# Patient Record
Sex: Male | Born: 1950 | Race: Black or African American | Hispanic: No | Marital: Married | Smoking: Never smoker
Health system: Southern US, Community
[De-identification: ages and names within clinical notes are randomized; demographics above are authoritative.]

## PROBLEM LIST (undated history)

## (undated) DIAGNOSIS — I10 Essential (primary) hypertension: Secondary | ICD-10-CM

## (undated) DIAGNOSIS — I509 Heart failure, unspecified: Secondary | ICD-10-CM

## (undated) DIAGNOSIS — E119 Type 2 diabetes mellitus without complications: Secondary | ICD-10-CM

## (undated) HISTORY — PX: EYE SURGERY: SHX253

## (undated) HISTORY — DX: Heart failure, unspecified: I50.9

## (undated) HISTORY — PX: HAND TENDON SURGERY: SHX663

---

## 2002-04-14 ENCOUNTER — Emergency Department (HOSPITAL_COMMUNITY): Admission: EM | Admit: 2002-04-14 | Discharge: 2002-04-14 | Payer: Self-pay | Admitting: Emergency Medicine

## 2005-04-06 ENCOUNTER — Emergency Department (HOSPITAL_COMMUNITY): Admission: EM | Admit: 2005-04-06 | Discharge: 2005-04-07 | Payer: Self-pay | Admitting: Emergency Medicine

## 2016-08-01 ENCOUNTER — Ambulatory Visit (INDEPENDENT_AMBULATORY_CARE_PROVIDER_SITE_OTHER): Payer: Commercial Managed Care - HMO | Admitting: Orthopaedic Surgery

## 2016-08-01 ENCOUNTER — Ambulatory Visit (INDEPENDENT_AMBULATORY_CARE_PROVIDER_SITE_OTHER): Payer: Commercial Managed Care - HMO

## 2016-08-01 ENCOUNTER — Encounter (INDEPENDENT_AMBULATORY_CARE_PROVIDER_SITE_OTHER): Payer: Self-pay | Admitting: Orthopaedic Surgery

## 2016-08-01 ENCOUNTER — Encounter (INDEPENDENT_AMBULATORY_CARE_PROVIDER_SITE_OTHER): Payer: Self-pay

## 2016-08-01 VITALS — Ht 70.0 in | Wt 225.0 lb

## 2016-08-01 DIAGNOSIS — M25552 Pain in left hip: Secondary | ICD-10-CM

## 2016-08-01 DIAGNOSIS — M1611 Unilateral primary osteoarthritis, right hip: Secondary | ICD-10-CM

## 2016-08-01 NOTE — Progress Notes (Signed)
Office Visit Note   Patient: Frederick Harris           Date of Birth: 1950-12-21           MRN: [id number] Visit Date: 08/01/2016              Requested by: No referring provider defined for this encounter. PCP: Frederick HidesMILLER, RYAN DEAN, PA   Assessment & Plan: Visit Diagnoses:  1. Pain in left hip   2. Unilateral primary osteoarthritis, right hip     Plan: He would like to try an intra-articular injection under fluoroscopy by Dr. Alvester MorinNewton and his left hip. We will work on arranging that. I'll see him back myself in 4 weeks and all of artery had injection in his left hip ointment go over things from there. We had a long thorough discussion about what the disease process arthrodesis in his left hip. He's had successful injections in his shoulder and knee before and like to at least try this and his hip in spite of the arthritis and I agree with him trying this conservative approach.  Follow-Up Instructions: Return in about 4 weeks (around 08/29/2016).   Orders:  Orders Placed This Encounter  Procedures  . XR HIP UNILAT W OR W/O PELVIS 2-3 VIEWS LEFT   No orders of the defined types were placed in this encounter.     Procedures: No procedures performed   Clinical Data: No additional findings.   Subjective: Chief Complaint  Patient presents with  . Left Hip - Pain    Chronic left hip pain, worse with walking. Pain comes and goes. NKI but did play basketball and golf. Saw Dr. Otelia SergeantNitka in 2014 for left hip.     HPI The patient is been having chronic pain and stiffness his left hip for long period of time now hurts mainly in the groin and hurts with activities. Dr. Otelia SergeantNitka to send my way to evaluate his hip again. He first started Dr. Theda Sersnegative for the hip in 2014. He seen Dr. Otelia SergeantNitka up for an knee and back issues recently as well. He's done well with injections in the past in other areas. He is a diabetic but his hemoglobin A1c was below 7. He is active in the gym working out. He is retired  now. Review of Systems He denies a chest pain, headache, short of breath, fever, chills, nausea, vomiting.  Objective: Vital Signs: Ht 5\' 10"  (1.778 m)   Wt 225 lb (102.1 kg)   BMI 32.28 kg/m   Physical Exam He is alert and oriented 3 in no acute distress Ortho Exam Examination of his left hip shows significant stiffness with attempts of internal/external rotation. His leg lengths are equal. His right hip exam is normal. His left knee exam is normal. Specialty Comments:  No specialty comments available.  Imaging: Xr Hip Unilat W Or W/o Pelvis 2-3 Views Left  Result Date: 08/01/2016 An AP pelvis and lateral of his left hip shows severe osteoarthritis and degenerative joint disease. There is superior lateral joint space narrowing is quite significant. There is cystic changes in the acetabular side of things. This particular osteophytes as well as sclerotic changes.    PMFS History: There are no active problems to display for this patient.  No past medical history on file.  No family history on file.  No past surgical history on file. Social History   Occupational History  . Not on file.   Social History Main Topics  . Smoking  status: Never Smoker  . Smokeless tobacco: Never Used  . Alcohol use No  . Drug use: No  . Sexual activity: Not on file

## 2016-08-02 ENCOUNTER — Other Ambulatory Visit (INDEPENDENT_AMBULATORY_CARE_PROVIDER_SITE_OTHER): Payer: Self-pay

## 2016-08-02 DIAGNOSIS — M25552 Pain in left hip: Secondary | ICD-10-CM

## 2016-08-18 ENCOUNTER — Encounter (INDEPENDENT_AMBULATORY_CARE_PROVIDER_SITE_OTHER): Payer: Self-pay | Admitting: Physical Medicine and Rehabilitation

## 2016-08-18 ENCOUNTER — Ambulatory Visit (INDEPENDENT_AMBULATORY_CARE_PROVIDER_SITE_OTHER): Payer: Commercial Managed Care - HMO

## 2016-08-18 ENCOUNTER — Ambulatory Visit (INDEPENDENT_AMBULATORY_CARE_PROVIDER_SITE_OTHER): Payer: Commercial Managed Care - HMO | Admitting: Physical Medicine and Rehabilitation

## 2016-08-18 VITALS — BP 130/86

## 2016-08-18 DIAGNOSIS — M25552 Pain in left hip: Secondary | ICD-10-CM | POA: Diagnosis not present

## 2016-08-18 NOTE — Patient Instructions (Signed)

## 2016-08-18 NOTE — Progress Notes (Signed)
   Frederick Harris - 66 y.o. male MRN [id number]  Date of birth: [birth year]  Office Visit Note: Visit Date: 08/18/2016 PCP: Maye Hides, PA Referred by: Maye Hides, PA  Subjective: Chief Complaint  Patient presents with  . Left Hip - Pain   HPI: [name] is a 66 year old gentleman suffering and complaining of left hip and groin pain for approximately 1 year. Gotten worse over time. Worse with stepping up and walking up hill. Denies groin pain today. He has been followed by Dr. Magnus Ivan who request diagnostic of therapeutic anesthetic hip arthrogram.    ROS Otherwise per HPI.  Assessment & Plan: Visit Diagnoses:  1. Pain in left hip     Plan: Findings:  Left diagnostic and therapeutic anesthetic hip arthrogram. Patient did get a leaf of his symptoms during the anesthetic phase.    Meds & Orders: No orders of the defined types were placed in this encounter.   Orders Placed This Encounter  Procedures  . Large Joint Injection/Arthrocentesis  . XR C-ARM NO REPORT    Follow-up: Return if symptoms worsen or fail to improve.   Procedures: Large Joint Inj Date/Time: 08/18/2016 2:59 PM Performed by: Tyrell Antonio Authorized by: Tyrell Antonio   Consent Given by:  Patient Site marked: the procedure site was marked   Timeout: prior to procedure the correct patient, procedure, and site was verified   Indications:  Pain and diagnostic evaluation Location:  Hip Site:  L hip joint Prep: patient was prepped and draped in usual sterile fashion   Needle Size:  22 G Approach:  Anterior Ultrasound Guidance: No   Fluoroscopic Guidance: No   Arthrogram: Yes   Medications:  80 mg triamcinolone acetonide 40 MG/ML; 3 mL bupivacaine 0.5 % Aspiration Attempted: Yes   Patient tolerance:  Patient tolerated the procedure well with no immediate complications  Arthrogram demonstrated excellent flow of contrast throughout the joint surface without extravasation or obvious defect.   The patient had relief of symptoms during the anesthetic phase of the injection.     No notes on file   Clinical History: No specialty comments available.  He reports that he has never smoked. He has never used smokeless tobacco. No results for input(s): HGBA1C, LABURIC in the last 8760 hours.  Objective:  VS:  HT:    WT:   BMI:     BP:130/86  HR: bpm  TEMP: ( )  RESP:  Physical Exam  Musculoskeletal:  Patient with decreased range of motion on the left hip with groin pain on internal rotation.    Ortho Exam Imaging: No results found.  Past Medical/Family/Surgical/Social History: Medications & Allergies reviewed per EMR There are no active problems to display for this patient.  No past medical history on file. No family history on file. No past surgical history on file. Social History   Occupational History  . Not on file.   Social History Main Topics  . Smoking status: Never Smoker  . Smokeless tobacco: Never Used  . Alcohol use No  . Drug use: No  . Sexual activity: Not on file

## 2016-08-22 MED ORDER — BUPIVACAINE HCL 0.5 % IJ SOLN
3.0000 mL | INTRAMUSCULAR | Status: AC | PRN
  Administered 2016-08-18: 3 mL via INTRA_ARTICULAR

## 2016-08-22 MED ORDER — TRIAMCINOLONE ACETONIDE 40 MG/ML IJ SUSP
80.0000 mg | INTRAMUSCULAR | Status: AC | PRN
  Administered 2016-08-18: 80 mg via INTRA_ARTICULAR

## 2016-09-01 ENCOUNTER — Ambulatory Visit (INDEPENDENT_AMBULATORY_CARE_PROVIDER_SITE_OTHER): Payer: Commercial Managed Care - HMO | Admitting: Orthopaedic Surgery

## 2016-09-21 ENCOUNTER — Ambulatory Visit (INDEPENDENT_AMBULATORY_CARE_PROVIDER_SITE_OTHER): Payer: Commercial Managed Care - HMO | Admitting: Orthopaedic Surgery

## 2017-03-09 ENCOUNTER — Other Ambulatory Visit (INDEPENDENT_AMBULATORY_CARE_PROVIDER_SITE_OTHER): Payer: Self-pay

## 2017-03-09 ENCOUNTER — Ambulatory Visit (INDEPENDENT_AMBULATORY_CARE_PROVIDER_SITE_OTHER): Payer: Commercial Managed Care - HMO | Admitting: Orthopaedic Surgery

## 2017-03-09 DIAGNOSIS — M25552 Pain in left hip: Secondary | ICD-10-CM

## 2017-03-09 DIAGNOSIS — M1612 Unilateral primary osteoarthritis, left hip: Secondary | ICD-10-CM | POA: Diagnosis not present

## 2017-03-09 DIAGNOSIS — M7062 Trochanteric bursitis, left hip: Secondary | ICD-10-CM

## 2017-03-09 MED ORDER — TRAMADOL HCL 50 MG PO TABS: 50.0000 mg | ORAL_TABLET | Freq: Four times a day (QID) | ORAL | 0 refills | Status: DC | PRN

## 2017-03-09 MED ORDER — METHYLPREDNISOLONE ACETATE 40 MG/ML IJ SUSP
40.0000 mg | INTRAMUSCULAR | Status: AC | PRN
  Administered 2017-03-09: 40 mg via INTRA_ARTICULAR

## 2017-03-09 MED ORDER — LIDOCAINE HCL 1 % IJ SOLN
3.0000 mL | INTRAMUSCULAR | Status: AC | PRN
  Administered 2017-03-09: 3 mL

## 2017-03-09 NOTE — Progress Notes (Signed)
Office Visit Note   Patient: Frederick Harris           Date of Birth: [birth year]           MRN: [id number] Visit Date: 03/09/2017              Requested by: Maye HidesMiller, Ryan Dean, PA [address] [place], [place] [id number] PCP: Maye HidesMiller, Ryan Dean, PA   Assessment & Plan: Visit Diagnoses:  1. Unilateral primary osteoarthritis, left hip   2. Trochanteric bursitis, left hip     Plan: He has known end-stage arthritis of his left hip.  He also has associated trochanteric bursitis.  He tolerated the steroid injection of the trochanteric area today.  We will set him up for an intra-articular injection soon of the left hip with Dr. Alvester MorinNewton.  This would be a steroid injection of that left hip as well.  I talked him about hip replacement surgery at some point.  He knows to weight still at least 5-6 months between intra-articular steroid injections for hip.  Follow-Up Instructions: Return if symptoms worsen or fail to improve.   Orders:  Orders Placed This Encounter  Procedures  . Large Joint Injection/Arthrocentesis   Meds ordered this encounter  Medications  . traMADol (ULTRAM) 50 MG tablet    Sig: Take 1 tablet (50 mg total) by mouth every 6 (six) hours as needed.    Dispense:  30 tablet    Refill:  0      Procedures: Large Joint Inj Date/Time: 03/09/2017 4:29 PM Performed by: Kathryne HitchBLACKMAN, Yanely Mast Y Authorized by: Kathryne HitchBLACKMAN, Millissa Deese Y   Location:  Hip Site:  L greater trochanter Ultrasound Guidance: No   Fluoroscopic Guidance: No   Arthrogram: No   Medications:  3 mL lidocaine 1 %; 40 mg methylPREDNISolone acetate 40 MG/ML     Clinical Data: No additional findings.   Subjective: No chief complaint on file. Patient is well-known to us.  He has debilitating arthritis of his left hip with also associated trochanteric bursitis.  He last had a intra-articular steroid injection in early April of this year.  He knows to wait at least 5-6 months between steroid injections in the  hip joint.  He still is not ready for hip replacement surgery.  His hip pain is gotten bad again he would like to have an intra-articular injection as well as a trochanteric injection.  He has had no other changes in medical status.   HPI  Review of Systems He currently denies any headache, chest pain, shortness of breath, fever, chills, nausea, vomiting.  Objective: Vital Signs: There were no vitals taken for this visit.  Physical Exam Is alert and oriented x3 in no acute distress Ortho Exam Examination of his right hip is almost normal.  Examination of his left hip shows limitations with internal and external rotation with pain in the groin as well as pain of the trochanteric area. Specialty Comments:  No specialty comments available.  Imaging: No results found.   PMFS History: Patient Active Problem List   Diagnosis Date Noted  . Unilateral primary osteoarthritis, left hip 03/09/2017   No past medical history on file.  No family history on file.  No past surgical history on file. Social History   Occupational History  . Not on file.   Social History Main Topics  . Smoking status: Never Smoker  . Smokeless tobacco: Never Used  . Alcohol use No  . Drug use: No  . Sexual activity:  Not on file

## 2017-08-08 ENCOUNTER — Encounter (INDEPENDENT_AMBULATORY_CARE_PROVIDER_SITE_OTHER): Payer: Self-pay | Admitting: Orthopaedic Surgery

## 2017-08-08 ENCOUNTER — Ambulatory Visit (INDEPENDENT_AMBULATORY_CARE_PROVIDER_SITE_OTHER): Payer: Medicare Other

## 2017-08-08 ENCOUNTER — Ambulatory Visit (INDEPENDENT_AMBULATORY_CARE_PROVIDER_SITE_OTHER): Payer: Medicare Other | Admitting: Orthopaedic Surgery

## 2017-08-08 DIAGNOSIS — M5441 Lumbago with sciatica, right side: Secondary | ICD-10-CM | POA: Diagnosis not present

## 2017-08-08 DIAGNOSIS — M5442 Lumbago with sciatica, left side: Secondary | ICD-10-CM

## 2017-08-08 DIAGNOSIS — G8929 Other chronic pain: Secondary | ICD-10-CM

## 2017-08-08 MED ORDER — CYCLOBENZAPRINE HCL 10 MG PO TABS: 10.0000 mg | ORAL_TABLET | Freq: Every day | ORAL | 1 refills | Status: DC

## 2017-08-08 NOTE — Progress Notes (Signed)
Office Visit Note   Patient: Frederick Harris           Date of Birth: Oct 22, 1950           MRN: [id number] Visit Date: 08/08/2017              Requested by: Maye HidesMiller, Ryan Dean, PA [address] [place], [place] [id number] PCP: Maye HidesMiller, Ryan Dean, PA   Assessment & Plan: Visit Diagnoses:  1. Chronic bilateral low back pain with bilateral sciatica     Plan: We will send him to physical therapy for core strengthening, modalities stretching and home exercise program.  See him back in a month to check his progress lack of.  Ligaments of x-ray of her muscle spasm mostly at night.  In regards to his hip this point in time he does not wish to have an intra-articular injection or surgical  Follow-Up Instructions: Return in about 1 month (around 09/05/2017).   Orders:  Orders Placed This Encounter  Procedures  . XR Lumbar Spine 2-3 Views   Meds ordered this encounter  Medications  . cyclobenzaprine (FLEXERIL) 10 MG tablet    Sig: Take 1 tablet (10 mg total) by mouth at bedtime.    Dispense:  40 tablet    Refill:  1      Procedures: No procedures performed   Clinical Data: No additional findings.   Subjective: Chief Complaint  Patient presents with  . Lower Back - Pain    HPI [name] is well-known to Dr. Fortino SicMunson recently comes in today due to some low back pain which he has had for some period of time years is gotten worse over the last few weeks.  He states his been having some muscle spasm in the low back.  He is also having some radicular symptoms down the left leg into the foot at times.  Pain does awaken him at times.  He is tried some over-the-counter rubs on his back and also changes in the way he sleeps with a rolled up cushion under his back at times that helps alleviate some of the pain.  He is diabetic hemoglobin A1c/6.9 he states his glucose levels run around 110 in the mornings.  He has known osteoarthritis of his left hip.  Does have some groin pain left hip at times with  certain Review of Systems No bowel bladder dysfunction.  Please see HPI otherwise review of systems is negative.  Objective: Vital Signs: There were no vitals taken for this visit.  Physical Exam  Constitutional: He is oriented to person, place, and time. He appears well-developed and well-nourished. No distress.  Cardiovascular: Intact distal pulses.  Pulmonary/Chest: Effort normal.  Neurological: He is alert and oriented to person, place, and time.  Skin: He is not diaphoretic.  Psychiatric: He has a normal mood and affect.    Ortho Exam Bilateral hips good range of motion left hip pain with internal/external rotation.  And slight tenderness of the left trochanteric region.  No tenderness over the lumbar spine or paraspinous region.  Negative straight leg raise bilaterally.  5 out of 5 strength bilateral lower extremities against resistance.  Deep tendon reflexes are equal and symmetric at the knees and ankles.  Dorsal pedal pulses are 2+ bilaterally sensation grossly intact bilateral feet light touch.  He is able to touch his toes and has limited extension of lumbar spine with some discomfort.  Specialty Comments:  No specialty comments available.  Imaging: Xr Lumbar Spine 2-3  Views  Result Date: 08/08/2017 Lumbar spine 2 views.  No acute fractures.  Decreased disc space at L3-4 L4-5 and L5-S1 with plate spurring near fusion of the L4-5 osteophytes.  Loss of lordotic curvature.  No spondylolisthesis.  Slight scoliosis.    PMFS History: Patient Active Problem List   Diagnosis Date Noted  . Unilateral primary osteoarthritis, left hip 03/09/2017   History reviewed. No pertinent past medical history.  History reviewed. No pertinent family history.  History reviewed. No pertinent surgical history. Social History   Occupational History  . Not on file  Tobacco Use  . Smoking status: Never Smoker  . Smokeless tobacco: Never Used  Substance and Sexual Activity  . Alcohol use: No   . Drug use: No  . Sexual activity: Not on file

## 2017-09-12 ENCOUNTER — Ambulatory Visit (INDEPENDENT_AMBULATORY_CARE_PROVIDER_SITE_OTHER): Payer: Medicare Other | Admitting: Orthopaedic Surgery

## 2018-08-11 ENCOUNTER — Encounter (HOSPITAL_COMMUNITY): Payer: Self-pay | Admitting: Emergency Medicine

## 2018-08-11 ENCOUNTER — Emergency Department (HOSPITAL_COMMUNITY)
Admission: EM | Admit: 2018-08-11 | Discharge: 2018-08-11 | Disposition: A | Discharge status: Home / Self Care | Payer: Medicare Other | Attending: Emergency Medicine | Admitting: Emergency Medicine

## 2018-08-11 ENCOUNTER — Emergency Department (HOSPITAL_COMMUNITY): Payer: Medicare Other

## 2018-08-11 ENCOUNTER — Other Ambulatory Visit: Payer: Self-pay

## 2018-08-11 DIAGNOSIS — Y939 Activity, unspecified: Secondary | ICD-10-CM | POA: Insufficient documentation

## 2018-08-11 DIAGNOSIS — Z23 Encounter for immunization: Secondary | ICD-10-CM | POA: Diagnosis not present

## 2018-08-11 DIAGNOSIS — W269XXA Contact with unspecified sharp object(s), initial encounter: Secondary | ICD-10-CM | POA: Insufficient documentation

## 2018-08-11 DIAGNOSIS — Z7984 Long term (current) use of oral hypoglycemic drugs: Secondary | ICD-10-CM | POA: Diagnosis not present

## 2018-08-11 DIAGNOSIS — S61216A Laceration without foreign body of right little finger without damage to nail, initial encounter: Secondary | ICD-10-CM | POA: Diagnosis not present

## 2018-08-11 DIAGNOSIS — Y92838 Other recreation area as the place of occurrence of the external cause: Secondary | ICD-10-CM | POA: Insufficient documentation

## 2018-08-11 DIAGNOSIS — Y999 Unspecified external cause status: Secondary | ICD-10-CM | POA: Insufficient documentation

## 2018-08-11 DIAGNOSIS — Z79899 Other long term (current) drug therapy: Secondary | ICD-10-CM | POA: Diagnosis not present

## 2018-08-11 DIAGNOSIS — W010XXA Fall on same level from slipping, tripping and stumbling without subsequent striking against object, initial encounter: Secondary | ICD-10-CM | POA: Insufficient documentation

## 2018-08-11 DIAGNOSIS — E119 Type 2 diabetes mellitus without complications: Secondary | ICD-10-CM | POA: Diagnosis not present

## 2018-08-11 DIAGNOSIS — I10 Essential (primary) hypertension: Secondary | ICD-10-CM | POA: Diagnosis not present

## 2018-08-11 HISTORY — DX: Type 2 diabetes mellitus without complications: E11.9

## 2018-08-11 HISTORY — DX: Essential (primary) hypertension: I10

## 2018-08-11 MED ORDER — LIDOCAINE HCL 2 % IJ SOLN
INTRAMUSCULAR | Status: AC
  Administered 2018-08-11: 18:00:00 200 mg
  Filled 2018-08-11: qty 20

## 2018-08-11 MED ORDER — TETANUS-DIPHTH-ACELL PERTUSSIS 5-2.5-18.5 LF-MCG/0.5 IM SUSP
0.5000 mL | Freq: Once | INTRAMUSCULAR | Status: AC
  Administered 2018-08-11: 17:00:00 0.5 mL via INTRAMUSCULAR
  Filled 2018-08-11: qty 0.5

## 2018-08-11 MED ORDER — LIDOCAINE HCL 2 % IJ SOLN
10.0000 mL | Freq: Once | INTRAMUSCULAR | Status: AC
  Administered 2018-08-11: 18:00:00 200 mg

## 2018-08-11 MED ORDER — LIDOCAINE-EPINEPHRINE (PF) 2 %-1:200000 IJ SOLN: 10.0000 mL | Freq: Once | INTRAMUSCULAR | Status: DC

## 2018-08-11 MED ORDER — LIDOCAINE-EPINEPHRINE (PF) 2 %-1:200000 IJ SOLN
10.0000 mL | Freq: Once | INTRAMUSCULAR | Status: DC
  Filled 2018-08-11: qty 20

## 2018-08-11 NOTE — Discharge Instructions (Addendum)
Keep wound clean with mild soap and water. Wear splint during the day to help prevent bending your finger excessively which may make the wound open up. Keep area covered with a topical antibiotic ointment and bandage, keep bandage dry, and do not submerge in water for 24 hours. Ice and elevate for additional pain and swelling relief. Alternate between Ibuprofen and Tylenol for additional pain relief. Follow up with your primary care doctor or the [facility] in approximately 3 days for wound recheck and again in 7-10 days for recheck and suture removal. Monitor area for signs of infection to include, but not limited to: increasing pain, spreading redness, drainage/pus, worsening swelling, or fevers. Return to emergency department for emergent changing or worsening symptoms.

## 2018-08-11 NOTE — ED Notes (Signed)
Bed: WTR5 Expected date:  Expected time:  Means of arrival:  Comments: 

## 2018-08-11 NOTE — ED Provider Notes (Signed)
[facility]-EMERGENCY DEPT Provider Note   CSN: [id number] Arrival date & time: 08/11/18  1634    History   Chief Complaint Chief Complaint  Patient presents with   Finger Injury    HPI    Frederick Harris is a 68 y.o. male with a PMHx of HTN and DM2, who presents to the ED with complaints of right pinky laceration sustained around 11 AM, approximately 6 hours prior to evaluation.  Patient states that he slipped and fell with something in his hand which caused the laceration.  The bleeding has been controlled with a bandage.  He denies having any pain.  He has not done anything for it, no known aggravating factors.  He also denies having any loss of range of motion of the finger, numbness, tingling, focal weakness, or any other complaints or concerns at this time.  No other injury sustained during the incident.  Of note, he takes blood pressure medication, took it this morning, denies having any headache, vision changes, chest pain, shortness of breath, or any other complaints related to high blood pressure.  He does not have a hand specialist that he sees.  His last tetanus shot was about 6 years ago.  The history is provided by the patient and medical records. No language interpreter was used.    Past Medical History:  Diagnosis Date   Diabetes mellitus without complication (HCC)    Hypertension     Patient Active Problem List   Diagnosis Date Noted   Unilateral primary osteoarthritis, left hip 03/09/2017    History reviewed. No pertinent surgical history.      Home Medications    Prior to Admission medications   Medication Sig Start Date End Date Taking? Authorizing Provider  cyclobenzaprine (FLEXERIL) 10 MG tablet Take 1 tablet (10 mg total) by mouth at bedtime. 08/08/17   Kirtland Bouchard, PA-C  losartan (COZAAR) 25 MG tablet Take 25 mg by mouth daily.    [provider]  metFORMIN (GLUCOPHAGE) 500 MG tablet Take by mouth 2 (two) times daily  with a meal.    [provider]  traMADol (ULTRAM) 50 MG tablet Take 1 tablet (50 mg total) by mouth every 6 (six) hours as needed. 03/09/17   Kathryne Hitch, MD    Family History No family history on file.  Social History Social History   Tobacco Use   Smoking status: Never Smoker   Smokeless tobacco: Never Used  Substance Use Topics   Alcohol use: No   Drug use: No     Allergies   Iodine   Review of Systems Review of Systems  Eyes: Negative for visual disturbance.  Respiratory: Negative for shortness of breath.   Cardiovascular: Negative for chest pain.  Musculoskeletal: Negative for arthralgias and myalgias.  Skin: Positive for wound.  Allergic/Immunologic: Positive for immunocompromised state (DM2).  Neurological: Negative for weakness, numbness and headaches.     Physical Exam Updated Vital Signs BP (!) 175/87 (BP Location: Right Arm)    Pulse 71    Temp 98.1 F (36.7 C) (Oral)    Resp 18    SpO2 98%   Physical Exam Vitals signs and nursing note reviewed.  Constitutional:      General: He is not in acute distress.    Appearance: Normal appearance. He is well-developed. He is not toxic-appearing.     Comments: Afebrile, nontoxic, NAD, HTN noted  HENT:     Head: Normocephalic and atraumatic.  Eyes:  General:        Right eye: No discharge.        Left eye: No discharge.     Conjunctiva/sclera: Conjunctivae normal.  Neck:     Musculoskeletal: Normal range of motion and neck supple.  Cardiovascular:     Rate and Rhythm: Normal rate.     Pulses: Normal pulses.  Pulmonary:     Effort: Pulmonary effort is normal. No respiratory distress.  Abdominal:     General: There is no distension.  Musculoskeletal: Normal range of motion.     Right hand: He exhibits tenderness and laceration. He exhibits normal range of motion, no bony tenderness, normal capillary refill, no deformity and no swelling. Normal sensation noted. Normal strength  noted.       Hands:     Comments: R pinky with FROM intact at all joints of the digit, with V shaped ~3cm (total length) laceration to the palmar aspect of the proximal phalanx, no ongoing bleeding, no retained FBs noted, does not extend to underlying muscle/tendon/bone. Minimal TTP to the wound but otherwise no bony or joint TTP to remainder of the digit or hand. No crepitus or deformity. No bruising or swelling. Strength and sensation grossly intact, distal pulses intact, soft compartments. SEE PICTURE BELOW  Skin:    General: Skin is warm and dry.     Findings: Laceration present. No rash.     Comments: R pinky lac as mentioned above and pictured below  Neurological:     Mental Status: He is alert and oriented to person, place, and time.     Sensory: Sensation is intact. No sensory deficit.     Motor: Motor function is intact.  Psychiatric:        Mood and Affect: Mood and affect normal.        Behavior: Behavior normal.        ED Treatments / Results  Labs (all labs ordered are listed, but only abnormal results are displayed) Labs Reviewed - No data to display  EKG None  Radiology Dg Hand Complete Right  Result Date: 08/11/2018 CLINICAL DATA:  Fall with laceration fifth MCP joint. EXAM: RIGHT HAND - COMPLETE 3+ VIEW COMPARISON:  None. FINDINGS: Mild degenerate change over the radiocarpal joint. Mild-to-moderate degenerative changes over the carpal bones with moderate degenerative change of the first carpometacarpal joint. Moderate degenerative changes of the MCP joints with post osteophytes of the metacarpal heads. Mild degenerate change of the interphalangeal joints. No acute fracture or dislocation. IMPRESSION: No acute findings. Moderate degenerative changes as described without osteophytes of the metacarpal heads as can be seen in hemochromatosis and CPPD. Electronically Signed   By: Elberta Fortisaniel  Boyle M.D.   On: 08/11/2018 17:17    Procedures .Marland Kitchen.Laceration Repair Date/Time:  08/11/2018 5:38 PM Performed by: Rhona RaiderStreet, Melecio Cueto, PA-C Authorized by: Rhona RaiderStreet, Tysheena Ginzburg, [place]-C   Consent:    Consent obtained:  Verbal   Consent given by:  Patient   Risks discussed:  Pain   Alternatives discussed:  No treatment Anesthesia (see MAR for exact dosages):    Anesthesia method:  Local infiltration   Local anesthetic:  Lidocaine 2% w/o epi Laceration details:    Location:  Finger   Finger location:  R small finger   Length (cm):  3   Depth (mm):  6 Repair type:    Repair type:  Simple Pre-procedure details:    Preparation:  Patient was prepped and draped in usual sterile fashion and imaging obtained to evaluate for  foreign bodies Exploration:    Hemostasis achieved with:  Direct pressure   Wound exploration: wound explored through full range of motion and entire depth of wound probed and visualized     Wound extent: no foreign bodies/material noted, no tendon damage noted and no underlying fracture noted     Contaminated: no   Treatment:    Area cleansed with:  Saline   Amount of cleaning:  Standard   Irrigation solution:  Sterile saline   Irrigation method:  Syringe Skin repair:    Repair method:  Sutures   Suture size:  5-0   Suture material:  Prolene   Suture technique:  Simple interrupted   Number of sutures:  3 Approximation:    Approximation:  Close Post-procedure details:    Dressing:  Splint for protection and non-adherent dressing   Patient tolerance of procedure:  Tolerated well, no immediate complications   (including critical care time)  Medications Ordered in ED Medications  lidocaine (XYLOCAINE) 2 % (with pres) injection 200 mg (has no administration in time range)  Tdap (BOOSTRIX) injection 0.5 mL (0.5 mLs Intramuscular Given 08/11/18 1713)     Initial Impression / Assessment and Plan / ED Course  I have reviewed the triage vital signs and the nursing notes.  Pertinent labs & imaging results that were available during my care of the patient  were reviewed by me and considered in my medical decision making (see chart for details).        68 y.o. male here with R pinky laceration sustained about 6hrs prior to evaluation. Tripped and fell with something in his hand and it cut his pinky. On exam, V shaped ~3cm (total length) laceration to palmar aspect of R 5th digit over the proximal phalanx, no ongoing bleeding, no retained FBs, FROM intact in digit, no tendon/muscle or osseous involvement. No crepitus or deformity. Mild TTP overlying the wound. NVI. Will update Tdap, obtain xray, and plan for suture closure. Of note, pt hypertensive but asymptomatic, doubt need for emergent work up at this time.   5:50 PM R hand xray without acute findings, some moderate degenerative changes without osteophytes of several areas incidentally noted. Wound repaired with 5-0 prolene x3 simple interrupted sutures, adequate hemostasis and cosmesis achieved. Some movement of the wound with finger flexion, will apply splint while wound is healing just for protection of the sutures. Advised proper wound care, RICE, tylenol/motrin for pain, and f/up with PCP in 3 days for wound check and in 7-10 days for suture removal. Doubt need for ppx abx.  I explained the diagnosis and have given explicit precautions to return to the ER including for any other new or worsening symptoms. The patient understands and accepts the medical plan as it's been dictated and I have answered their questions. Discharge instructions concerning home care and prescriptions have been given. The patient is STABLE and is discharged to home in good condition.    Final Clinical Impressions(s) / ED Diagnoses   Final diagnoses:  Laceration of right little finger without foreign body without damage to nail, initial encounter    ED Discharge Orders    [address], [place], [place] 08/11/18 1753    Arby Barrette, MD 08/12/18 1134

## 2018-08-11 NOTE — ED Triage Notes (Addendum)
[place] left 5th finger on something when fell.

## 2019-08-20 ENCOUNTER — Ambulatory Visit: Payer: No Typology Code available for payment source | Admitting: Orthopaedic Surgery

## 2019-08-21 ENCOUNTER — Other Ambulatory Visit: Payer: Self-pay

## 2019-08-21 ENCOUNTER — Ambulatory Visit (INDEPENDENT_AMBULATORY_CARE_PROVIDER_SITE_OTHER): Payer: Medicare Other

## 2019-08-21 ENCOUNTER — Ambulatory Visit (INDEPENDENT_AMBULATORY_CARE_PROVIDER_SITE_OTHER): Payer: Medicare Other | Admitting: Orthopaedic Surgery

## 2019-08-21 DIAGNOSIS — M1612 Unilateral primary osteoarthritis, left hip: Secondary | ICD-10-CM | POA: Diagnosis not present

## 2019-08-21 DIAGNOSIS — M25551 Pain in right hip: Secondary | ICD-10-CM | POA: Diagnosis not present

## 2019-08-21 DIAGNOSIS — M1611 Unilateral primary osteoarthritis, right hip: Secondary | ICD-10-CM | POA: Diagnosis not present

## 2019-08-21 NOTE — Progress Notes (Signed)
Office Visit Note   Patient: Frederick Harris           Date of Birth: [birth year]           MRN: [id number] Visit Date: 08/21/2019              Requested by: Maye Hides, PA [address] [place],  [place] [address] PCP: Maye Hides, PA   Assessment & Plan: Visit Diagnoses:  1. Pain in right hip   2. Unilateral primary osteoarthritis, left hip   3. Unilateral primary osteoarthritis, right hip     Plan: He understands that he does have significant arthritis in both hips.  He has had a successful intra-articular injection remotely in the past in his left hip.  That was as far back as I believe 2019.  I think it is reasonable to try a steroid injection under fluoroscopy by Dr. Alvester Morin in both hips.  The patient agrees with this treatment plan.  Once he has those injections he can follow-up with me as needed understanding that he needs to wait at least 5 to 6 months or more between injections or consider hip replacement surgery at some point if his pain worsens.  All questions and concerns were answered and addressed.  Follow-Up Instructions: Return if symptoms worsen or fail to improve.   Orders:  Orders Placed This Encounter  Procedures  . XR HIP UNILAT W OR W/O PELVIS 2-3 VIEWS RIGHT   No orders of the defined types were placed in this encounter.     Procedures: No procedures performed   Clinical Data: No additional findings.   Subjective: Chief Complaint  Patient presents with  . Right Hip - Pain  Patient is a 69 year old gentleman seen before due to moderate arthritis of his left hip.  He comes in today actually for right hip and groin pain.  He has still had some occasional left hip and groin pain.  At some point he had thought about hip replacement surgery.  He is an avid Teacher, English as a foreign language.  He is also prediabetic but not a smoker.  He said no other acute changes in medical status but certainly has been having worsening pain going the right hip with pivoting activities.  He  is an avid golfer as well.  HPI  Review of Systems He currently denies any headache, chest pain, shortness of breath, fever, chills, nausea, vomiting  Objective: Vital Signs: There were no vitals taken for this visit.  Physical Exam He is alert and orient x3 in no acute distress Ortho Exam Examination of both hips show stiffness with internal and external rotation with pain in the groin on both sides. Specialty Comments:  No specialty comments available.  Imaging: XR HIP UNILAT W OR W/O PELVIS 2-3 VIEWS RIGHT  Result Date: 08/21/2019 A low AP pelvis and lateral of the right hip shows both hips having significant arthritic findings with para-articular osteophytes and significant joint space narrowing.  There is otherwise no acute findings.    PMFS History: Patient Active Problem List   Diagnosis Date Noted  . Unilateral primary osteoarthritis, right hip 08/21/2019  . Unilateral primary osteoarthritis, left hip 03/09/2017   Past Medical History:  Diagnosis Date  . Diabetes mellitus without complication (HCC)   . Hypertension     No family history on file.  No past surgical history on file. Social History   Occupational History  . Not on file  Tobacco Use  . Smoking status: Never Smoker  .  Smokeless tobacco: Never Used  Substance and Sexual Activity  . Alcohol use: No  . Drug use: No  . Sexual activity: Not on file

## 2019-08-23 ENCOUNTER — Telehealth: Payer: Self-pay | Admitting: Orthopaedic Surgery

## 2019-08-23 MED ORDER — TRAMADOL HCL 50 MG PO TABS: 50.0000 mg | ORAL_TABLET | Freq: Four times a day (QID) | ORAL | 0 refills | Status: DC | PRN

## 2019-08-23 NOTE — Telephone Encounter (Signed)
Patient's wife called stating that the patient seen Dr. Magnus Ivan on Wednesday and was suppose to send in an RX for Tramadol, but they have not heard anything from the Pharmacy.  Patient uses [facility] [address].  CB#[phone].  Thank you.

## 2019-08-23 NOTE — Telephone Encounter (Signed)
Something must have happened when I send it in.  I will go ahead and send in again right now.

## 2019-08-23 NOTE — Telephone Encounter (Signed)
Please advise 

## 2020-01-02 ENCOUNTER — Encounter (INDEPENDENT_AMBULATORY_CARE_PROVIDER_SITE_OTHER): Payer: Medicare Other | Admitting: Ophthalmology

## 2020-01-09 IMAGING — CR RIGHT HAND - COMPLETE 3+ VIEW
3 series · 3 of 3 positions shown · non-contrast
Comparison: None.

CLINICAL DATA: Fall with laceration fifth MCP joint.

EXAM:
RIGHT HAND - COMPLETE 3+ VIEW

[x hand pa right]
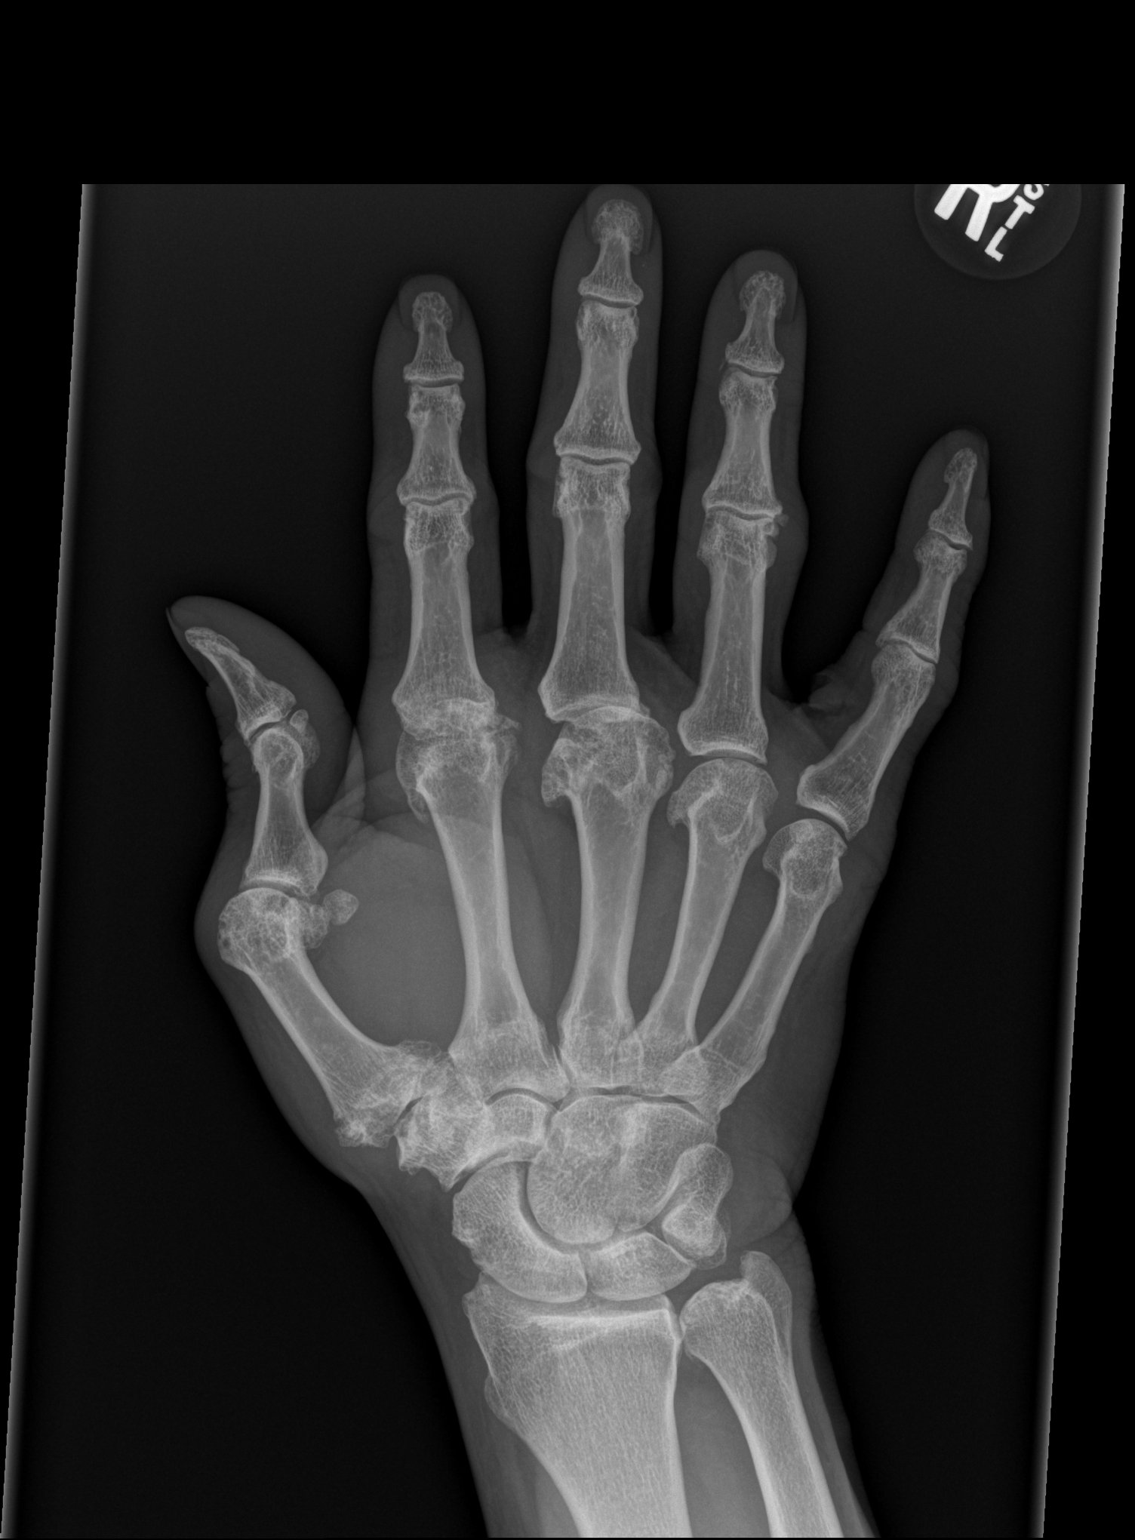

[x hand obl right]
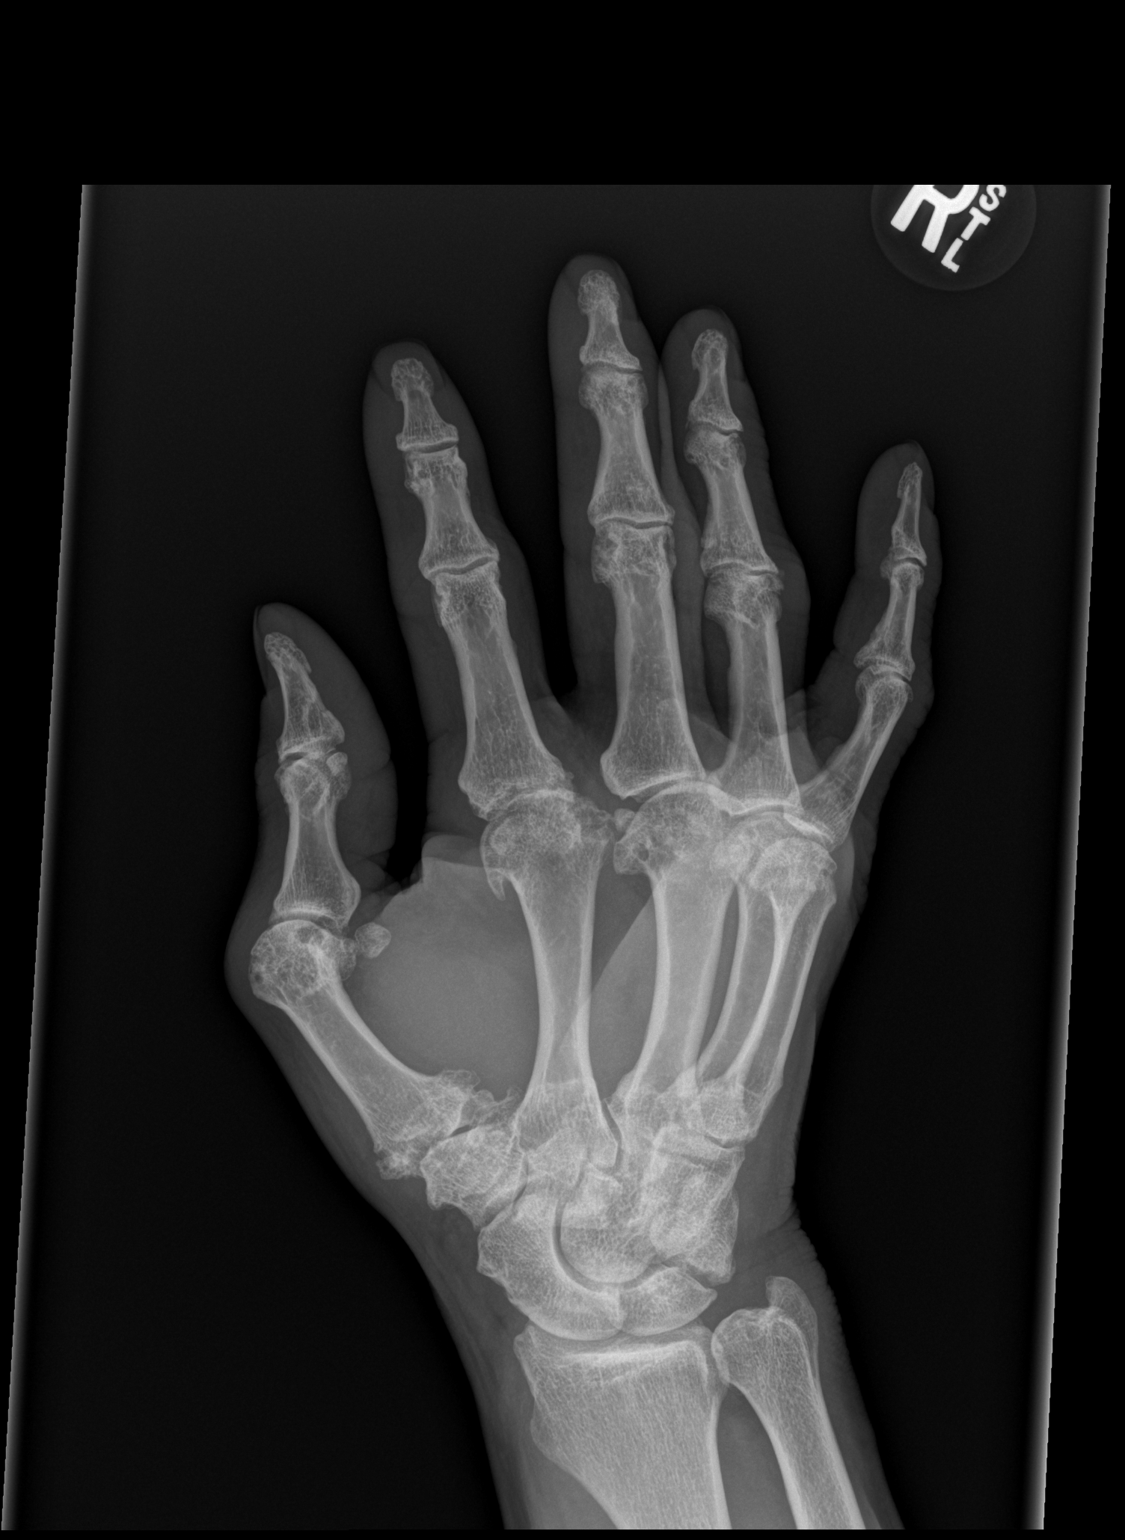

[x hand lat right]
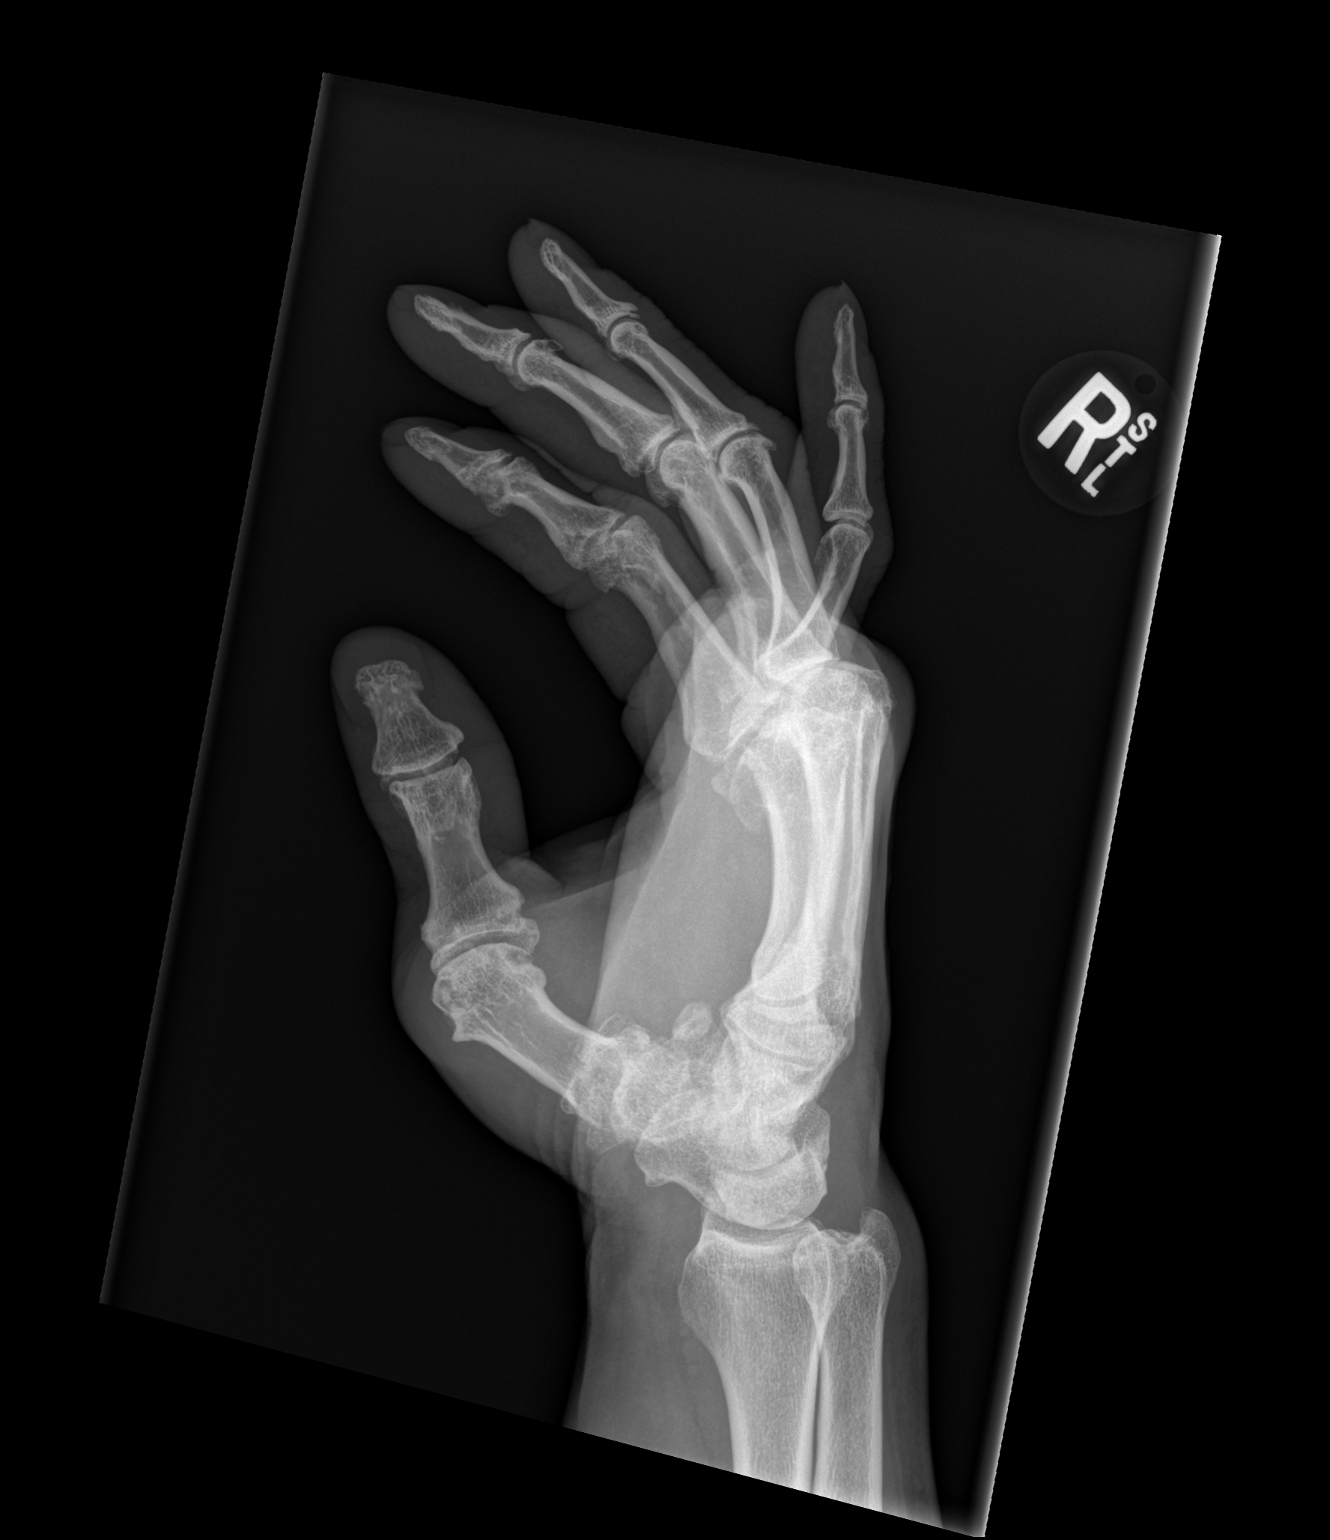

[3 of 3 positions shown; findings below may reference images not displayed]

FINDINGS: Mild degenerate change over the radiocarpal joint. Mild-to-moderate
degenerative changes over the carpal bones with moderate
degenerative change of the first carpometacarpal joint. Moderate
degenerative changes of the MCP joints with post osteophytes of the
metacarpal heads. Mild degenerate change of the interphalangeal
joints. No acute fracture or dislocation.
IMPRESSION: No acute findings.

Moderate degenerative changes as described without osteophytes of
the metacarpal heads as can be seen in hemochromatosis and CPPD.

## 2020-01-28 ENCOUNTER — Encounter (INDEPENDENT_AMBULATORY_CARE_PROVIDER_SITE_OTHER): Payer: Self-pay | Admitting: Ophthalmology

## 2020-01-28 ENCOUNTER — Ambulatory Visit (INDEPENDENT_AMBULATORY_CARE_PROVIDER_SITE_OTHER): Payer: Medicare Other | Admitting: Ophthalmology

## 2020-01-28 ENCOUNTER — Other Ambulatory Visit: Payer: Self-pay

## 2020-01-28 DIAGNOSIS — H43812 Vitreous degeneration, left eye: Secondary | ICD-10-CM | POA: Insufficient documentation

## 2020-01-28 DIAGNOSIS — H33011 Retinal detachment with single break, right eye: Secondary | ICD-10-CM | POA: Diagnosis not present

## 2020-01-28 DIAGNOSIS — H11002 Unspecified pterygium of left eye: Secondary | ICD-10-CM

## 2020-01-28 DIAGNOSIS — H35341 Macular cyst, hole, or pseudohole, right eye: Secondary | ICD-10-CM | POA: Insufficient documentation

## 2020-01-28 DIAGNOSIS — H11042 Peripheral pterygium, stationary, left eye: Secondary | ICD-10-CM | POA: Insufficient documentation

## 2020-01-28 DIAGNOSIS — E119 Type 2 diabetes mellitus without complications: Secondary | ICD-10-CM

## 2020-01-28 NOTE — Progress Notes (Signed)
01/28/2020     CHIEF COMPLAINT Patient presents for Diabetic Eye Exam   HISTORY OF PRESENT ILLNESS: Frederick Harris is a 69 y.o. male who presents to the clinic today for:   HPI    Diabetic Eye Exam    Vision is stable.  Associated Symptoms Floaters.  Negative for Flashes.  Diabetes characteristics include Type 2.  Blood sugar level is controlled.  Last Blood Glucose 115.  Last A1C 7.  I, the attending physician,  performed the HPI with the patient and updated documentation appropriately.          Comments    1 Year Diabetic Exam OU. OCT and FP  Pt states no changes in vision. Pt sees regular floaters but denies FOL.       Last edited by Elyse Jarvis on 01/28/2020  9:40 AM. (History)      Referring physician: Maye Hides, PA [phone] [place],  [place] [address]  HISTORICAL INFORMATION:   Selected notes from the MEDICAL RECORD NUMBER       CURRENT MEDICATIONS: Current Outpatient Medications (Ophthalmic Drugs)  Medication Sig  . polyvinyl alcohol (LIQUIFILM TEARS) 1.4 % ophthalmic solution Place 1 drop into both eyes as needed for dry eyes.   No current facility-administered medications for this visit. (Ophthalmic Drugs)   Current Outpatient Medications (Other)  Medication Sig  . BLACK CURRANT SEED OIL PO Take 1 capsule by mouth daily.  . Coenzyme Q10 (COQ10 PO) Take 1 tablet by mouth daily.  . Cyanocobalamin (VITAMIN B-12 PO) Take 1 tablet by mouth daily.  . cyclobenzaprine (FLEXERIL) 10 MG tablet Take 1 tablet (10 mg total) by mouth at bedtime. (Patient not taking: Reported on 08/11/2018)  . losartan-hydrochlorothiazide (HYZAAR) 100-25 MG tablet Take 1 tablet by mouth daily.  Marland Kitchen MAGNESIUM PO Take 1 tablet by mouth daily.  . metFORMIN (GLUCOPHAGE) 500 MG tablet Take by mouth 2 (two) times daily with a meal.  . traMADol (ULTRAM) 50 MG tablet Take 1-2 tablets (50-100 mg total) by mouth every 6 (six) hours as needed.   No current facility-administered  medications for this visit. (Other)      REVIEW OF SYSTEMS: ROS    Positive for: Endocrine   Last edited by Elyse Jarvis on 01/28/2020  9:40 AM. (History)       ALLERGIES Allergies  Allergen Reactions  . Iodine     PAST MEDICAL HISTORY Past Medical History:  Diagnosis Date  . Diabetes mellitus without complication (HCC)   . Hypertension    History reviewed. No pertinent surgical history.  FAMILY HISTORY History reviewed. No pertinent family history.  SOCIAL HISTORY Social History   Tobacco Use  . Smoking status: Never Smoker  . Smokeless tobacco: Never Used  Substance Use Topics  . Alcohol use: No  . Drug use: No         OPHTHALMIC EXAM:  Base Eye Exam    Visual Acuity (Snellen - Linear)      Right Left   Dist [place] 20/400 20/20 -2   Dist ph [place] 20/100 -1        Tonometry (Tonopen, 9:45 AM)      Right Left   Pressure 15 12       Pupils      Pupils Dark Light Shape React APD   Right PERRL 4 4 Round Minimal None   Left PERRL 4 3 Round Sluggish None       Visual Fields (Counting  fingers)      Left Right    Full    Restrictions  Partial outer superior temporal, superior nasal deficiencies       Neuro/Psych    Oriented x3: Yes   Mood/Affect: Normal       Dilation    Both eyes: 1.0% Mydriacyl, 2.5% Phenylephrine @ 9:45 AM        Slit Lamp and Fundus Exam    External Exam      Right Left   External Normal Normal       Slit Lamp Exam      Right Left   Lids/Lashes Normal Normal   Conjunctiva/Sclera White and quiet White and quiet   Cornea Clear Clear   Anterior Chamber Deep and quiet Deep and quiet   Iris Round and reactive Round and reactive   Anterior Vitreous Normal Normal       Fundus Exam      Right Left   Posterior Vitreous Clear, vitrectomized Normal   Disc Normal Normal   C/D Ratio .6 .6   Macula Chronic macular hole, 800 m, stable Normal   Vessels Normal NO DR   Periphery Good buckle 360, No holes or tears No  holes or tears          IMAGING AND PROCEDURES  Imaging and Procedures for 01/28/20  OCT, Retina - OU - Both Eyes       Right Eye Quality was good. Scan locations included subfoveal. Findings include macular hole.   Left Eye Quality was good. Scan locations included subfoveal. Central Foveal Thickness: 270.   Notes OS with PVD  OD with chronic macular hole       Color Fundus Photography Optos - OU - Both Eyes       Right Eye Progression has been stable.   Left Eye Progression has been stable. Disc findings include normal observations. Macula : normal observations.   Notes Macular hole OD stable, good scleral buckle, retina attached good chorioretinal scars                ASSESSMENT/PLAN:  Posterior vitreous detachment of left eye   The nature of posterior vitreous detachment was discussed with the patient as well as its physiology, its age prevalence, and its possible implication regarding retinal breaks and detachment.  An informational brochure was given to the patient.  All the patient's questions were answered.  The patient was asked to return if new or different flashes or floaters develops.   Patient was instructed to contact office immediately if any changes were noticed. I explained to the patient that vitreous inside the eye is similar to jello inside a bowl. As the jello melts it can start to pull away from the bowl, similarly the vitreous throughout our lives can begin to pull away from the retina. That process is called a posterior vitreous detachment. In some cases, the vitreous can tug hard enough on the retina to form a retinal tear. I discussed with the patient the signs and symptoms of a retinal detachment.  Do not rub the eye.  Diabetes mellitus without complication (HCC) The patient has diabetes without any evidence of retinopathy. The patient advised to maintain good blood glucose control, excellent blood pressure control, and favorable levels  of cholesterol, low density lipoprotein, and high density lipoproteins. Follow up in 1 year was recommended. Explained that fluctuations in visual acuity , or "out of focus", may result from large variations of blood sugar control.  ICD-10-CM   1. Pterygium of left eye  H11.002   2. Macular hole of right eye  H35.341 OCT, Retina - OU - Both Eyes    Color Fundus Photography Optos - OU - Both Eyes  3. Retinal detachment of right eye with single break  H33.011   4. Posterior vitreous detachment of left eye  H43.812   5. Diabetes mellitus without complication (HCC)  E11.9     1.  No active disease in either eye, stable will continue to observe  2.  Patient continues to wear safety glasses at nearly all day  3.  Ophthalmic Meds Ordered this visit:  No orders of the defined types were placed in this encounter.      Return in about 1 year (around 01/27/2021) for DILATE OU, COLOR FP.  There are no Patient Instructions on file for this visit.   Explained the diagnoses, plan, and follow up with the patient and they expressed understanding.  Patient expressed understanding of the importance of proper follow up care.   Alford Highland Nikhil Osei M.D. Diseases & Surgery of the Retina and Vitreous [facility] 01/28/20     Abbreviations: M myopia (nearsighted); A astigmatism; H hyperopia (farsighted); P presbyopia; Mrx spectacle prescription;  CTL contact lenses; OD right eye; OS left eye; OU both eyes  XT exotropia; ET esotropia; PEK punctate epithelial keratitis; PEE punctate epithelial erosions; DES dry eye syndrome; MGD meibomian gland dysfunction; ATs artificial tears; PFAT's preservative free artificial tears; NSC nuclear sclerotic cataract; PSC posterior subcapsular cataract; ERM epi-retinal membrane; PVD posterior vitreous detachment; RD retinal detachment; DM diabetes mellitus; DR diabetic retinopathy; NPDR non-proliferative diabetic retinopathy; PDR proliferative diabetic  retinopathy; CSME clinically significant macular edema; DME diabetic macular edema; dbh dot blot hemorrhages; CWS cotton wool spot; POAG primary open angle glaucoma; C/D cup-to-disc ratio; HVF humphrey visual field; GVF goldmann visual field; OCT optical coherence tomography; IOP intraocular pressure; BRVO Branch retinal vein occlusion; CRVO central retinal vein occlusion; CRAO central retinal artery occlusion; BRAO branch retinal artery occlusion; RT retinal tear; SB scleral buckle; PPV pars plana vitrectomy; VH Vitreous hemorrhage; PRP panretinal laser photocoagulation; IVK intravitreal kenalog; VMT vitreomacular traction; MH Macular hole;  NVD neovascularization of the disc; NVE neovascularization elsewhere; AREDS age related eye disease study; ARMD age related macular degeneration; POAG primary open angle glaucoma; EBMD epithelial/anterior basement membrane dystrophy; ACIOL anterior chamber intraocular lens; IOL intraocular lens; PCIOL posterior chamber intraocular lens; Phaco/IOL phacoemulsification with intraocular lens placement; PRK photorefractive keratectomy; LASIK laser assisted in situ keratomileusis; HTN hypertension; DM diabetes mellitus; COPD chronic obstructive pulmonary disease

## 2020-01-28 NOTE — Assessment & Plan Note (Signed)

## 2020-01-28 NOTE — Assessment & Plan Note (Signed)

## 2020-03-21 ENCOUNTER — Ambulatory Visit: Payer: No Typology Code available for payment source

## 2020-04-10 ENCOUNTER — Ambulatory Visit: Payer: Medicare Other | Attending: Internal Medicine

## 2020-04-10 DIAGNOSIS — Z23 Encounter for immunization: Secondary | ICD-10-CM

## 2020-04-10 NOTE — Progress Notes (Signed)
   Covid-19 Vaccination Clinic  Name:  Frederick Harris    MRN: [id number] DOB: 03/14/1951  04/10/2020  [name] was observed post Covid-19 immunization for 15 minutes without incident. He was provided with Vaccine Information Sheet and instruction to access the V-Safe system.   [name] was instructed to call 911 with any severe reactions post vaccine: Marland Kitchen Difficulty breathing  . Swelling of face and throat  . A fast heartbeat  . A bad rash all over body  . Dizziness and weakness   Immunizations Administered    Name Date Dose VIS Date Route   Pfizer COVID-19 Vaccine 04/10/2020  4:57 PM 0.3 mL 02/26/2020 Intramuscular   Manufacturer: [employer], [employer]   Lot: [id number]   NDC: [address]-5

## 2020-08-12 ENCOUNTER — Ambulatory Visit (INDEPENDENT_AMBULATORY_CARE_PROVIDER_SITE_OTHER): Payer: Medicare Other | Admitting: Family Medicine

## 2020-08-12 ENCOUNTER — Ambulatory Visit: Payer: Self-pay

## 2020-08-12 ENCOUNTER — Encounter: Payer: Self-pay | Admitting: Family Medicine

## 2020-08-12 ENCOUNTER — Other Ambulatory Visit: Payer: Self-pay

## 2020-08-12 DIAGNOSIS — M1612 Unilateral primary osteoarthritis, left hip: Secondary | ICD-10-CM | POA: Diagnosis not present

## 2020-08-12 DIAGNOSIS — M1611 Unilateral primary osteoarthritis, right hip: Secondary | ICD-10-CM

## 2020-08-12 NOTE — Progress Notes (Signed)
   Office Visit Note   Patient: Frederick Harris           Date of Birth: 07-17-1950           MRN: [id number] Visit Date: 08/12/2020 Requested by: Maye Hides, PA [address],  [place] [address] PCP: Maye Hides, PA  Subjective: Chief Complaint  Patient presents with  . Left Hip - Pain    Chronic bilateral hip pain. Usually the left hurts worse than the right (especially with playing golf). Cortisone injections have worked well in the past. Requesting an injection in at least the left hip.  . Right Hip - Pain    HPI: He is here with bilateral hip pain, left greater than right.  History of osteoarthritis.  He had injections a couple years ago which gave him pretty good relief.  He is hoping to avoid surgery for a while and would like injections again.  He works at [employer].               ROS:   All other systems were reviewed and are negative.  Objective: Vital Signs: There were no vitals taken for this visit.  Physical Exam:  General:  Alert and oriented, in no acute distress. Pulm:  Breathing unlabored. Psy:  Normal mood, congruent affect.  Hips: Pain and stiffness with passive internal rotation bilaterally.    Imaging: US Guided Needle Placement - No Linked Charges  Result Date: 08/12/2020 Ultrasound guided injection is preferred based studies that show increased duration, increased effect, greater accuracy, decreased procedural pain, increased response rate, and decreased cost with ultrasound guided versus blind injection.   Verbal informed consent obtained.  Time-out conducted.  Noted no overlying erythema, induration, or other signs of local infection. Ultrasound-guided bilateral hip injection: After sterile prep with Betadine, injected 4 cc 0.25% bupivacaine without epinephrine and 6 mg betamethasone using a 22-gauge spinal needle, passing the needle through the iliofemoral ligament into the femoral head/neck junction.  Injectate seen filling both  joint capsules.  Good immediate relief.     Assessment & Plan: 1.  Bilateral hip osteoarthritis -Injections given as above.  Follow-up as needed.     Procedures: No procedures performed        PMFS History: Patient Active Problem List   Diagnosis Date Noted  . Pterygium of left eye 01/28/2020  . Macular hole of right eye 01/28/2020  . Retinal detachment of right eye with single break 01/28/2020  . Posterior vitreous detachment of left eye 01/28/2020  . Diabetes mellitus without complication (HCC) 01/28/2020  . Unilateral primary osteoarthritis, right hip 08/21/2019  . Unilateral primary osteoarthritis, left hip 03/09/2017   Past Medical History:  Diagnosis Date  . Diabetes mellitus without complication (HCC)   . Hypertension     No family history on file.  No past surgical history on file. Social History   Occupational History  . Not on file  Tobacco Use  . Smoking status: Never Smoker  . Smokeless tobacco: Never Used  Substance and Sexual Activity  . Alcohol use: No  . Drug use: No  . Sexual activity: Not on file

## 2021-01-18 ENCOUNTER — Ambulatory Visit: Payer: No Typology Code available for payment source | Admitting: Physician Assistant

## 2021-01-20 ENCOUNTER — Other Ambulatory Visit: Payer: Self-pay

## 2021-01-20 ENCOUNTER — Emergency Department (HOSPITAL_BASED_OUTPATIENT_CLINIC_OR_DEPARTMENT_OTHER)
Admission: EM | Admit: 2021-01-20 | Discharge: 2021-01-20 | Disposition: A | Discharge status: Home / Self Care | Payer: Medicare Other | Attending: Emergency Medicine | Admitting: Emergency Medicine

## 2021-01-20 ENCOUNTER — Encounter (HOSPITAL_BASED_OUTPATIENT_CLINIC_OR_DEPARTMENT_OTHER): Payer: Self-pay

## 2021-01-20 DIAGNOSIS — E162 Hypoglycemia, unspecified: Secondary | ICD-10-CM | POA: Diagnosis not present

## 2021-01-20 DIAGNOSIS — Z79899 Other long term (current) drug therapy: Secondary | ICD-10-CM | POA: Insufficient documentation

## 2021-01-20 DIAGNOSIS — R0602 Shortness of breath: Secondary | ICD-10-CM | POA: Diagnosis not present

## 2021-01-20 DIAGNOSIS — R531 Weakness: Secondary | ICD-10-CM | POA: Diagnosis not present

## 2021-01-20 DIAGNOSIS — Z7984 Long term (current) use of oral hypoglycemic drugs: Secondary | ICD-10-CM | POA: Insufficient documentation

## 2021-01-20 DIAGNOSIS — E119 Type 2 diabetes mellitus without complications: Secondary | ICD-10-CM | POA: Insufficient documentation

## 2021-01-20 DIAGNOSIS — R079 Chest pain, unspecified: Secondary | ICD-10-CM | POA: Insufficient documentation

## 2021-01-20 DIAGNOSIS — R42 Dizziness and giddiness: Secondary | ICD-10-CM | POA: Diagnosis not present

## 2021-01-20 DIAGNOSIS — I10 Essential (primary) hypertension: Secondary | ICD-10-CM | POA: Insufficient documentation

## 2021-01-20 LAB — TROPONIN I (HIGH SENSITIVITY)
Troponin I (High Sensitivity): 8 ng/L (ref <18)
Troponin I (High Sensitivity): 9 ng/L (ref <18)

## 2021-01-20 LAB — COMPREHENSIVE METABOLIC PANEL
ALT: 65 U/L — ABNORMAL HIGH (ref 0–44)
AST: 60 U/L — ABNORMAL HIGH (ref 15–41)
Albumin: 3.9 g/dL (ref 3.5–5.0)
Alkaline Phosphatase: 70 U/L (ref 38–126)
Anion gap: 8 (ref 5–15)
BUN: 13 mg/dL (ref 8–23)
CO2: 26 mmol/L (ref 22–32)
Calcium: 9 mg/dL (ref 8.9–10.3)
Chloride: 102 mmol/L (ref 98–111)
Creatinine, Ser: 0.89 mg/dL (ref 0.61–1.24)
GFR, Estimated: >60 mL/min (ref >60)
Glucose, Bld: 126 mg/dL — ABNORMAL HIGH (ref 70–99)
Potassium: 4 mmol/L (ref 3.5–5.1)
Sodium: 136 mmol/L (ref 135–145)
Total Bilirubin: 0.3 mg/dL (ref 0.3–1.2)
Total Protein: 7.6 g/dL (ref 6.5–8.1)

## 2021-01-20 LAB — CBC WITH DIFFERENTIAL/PLATELET
Abs Immature Granulocytes: 0.03 10*3/uL (ref 0.00–0.07)
Basophils Absolute: 0 10*3/uL (ref 0.0–0.1)
Basophils Relative: 0 %
Eosinophils Absolute: 0 10*3/uL (ref 0.0–0.5)
Eosinophils Relative: 0 %
HCT: 36.9 % — ABNORMAL LOW (ref 39.0–52.0)
Hemoglobin: 12.3 g/dL — ABNORMAL LOW (ref 13.0–17.0)
Immature Granulocytes: 0 %
Lymphocytes Relative: 10 %
Lymphs Abs: 0.9 10*3/uL (ref 0.7–4.0)
MCH: 28.7 pg (ref 26.0–34.0)
MCHC: 33.3 g/dL (ref 30.0–36.0)
MCV: 86.2 fL (ref 80.0–100.0)
Monocytes Absolute: 0.5 10*3/uL (ref 0.1–1.0)
Monocytes Relative: 6 %
Neutro Abs: 7.9 10*3/uL — ABNORMAL HIGH (ref 1.7–7.7)
Neutrophils Relative %: 84 %
Platelets: 239 10*3/uL (ref 150–400)
RBC: 4.28 MIL/uL (ref 4.22–5.81)
RDW: 13.2 % (ref 11.5–15.5)
WBC: 9.5 10*3/uL (ref 4.0–10.5)
nRBC: 0 % (ref 0.0–0.2)

## 2021-01-20 LAB — CBG MONITORING, ED: Glucose-Capillary: 154 mg/dL — ABNORMAL HIGH (ref 70–99)

## 2021-01-20 NOTE — ED Triage Notes (Signed)
Pt arrives via [facility] from work with low blood sugars pt reports his blood sugars were in the 50s states he ate reese's and had a Pepsi reports his blood sugar last with EMS was 150s. Pt reports some SOB when waking, reports that resolved after eating candy.

## 2021-01-20 NOTE — ED Provider Notes (Signed)
[facility] EMERGENCY DEPARTMENT Provider Note   CSN: [id number] Arrival date & time: 01/20/21  0931     History Chief Complaint  Patient presents with   Hypoglycemia    Frederick Harris is a 70 y.o. male with history of T2DM who presents to ED after episode of hypoglycemia earlier today. Patient states he went to work and began suddenly feeling lightheaded with associated chest pressure and SOB. Blood sugar was in the 50's. He cooked himself some food and also ate some candy before calling EMS. Last blood sugar with EMS was in 150s. His symptoms have since resolved but he was concerned as this has never happened ot him before. He takes metformin for his diabetes, and checks his BG every few days, readings usually around 110.    Hypoglycemia Associated symptoms: dizziness and shortness of breath   Associated symptoms: no vomiting       Past Medical History:  Diagnosis Date   Diabetes mellitus without complication (HCC)    Hypertension     Patient Active Problem List   Diagnosis Date Noted   Pterygium of left eye 01/28/2020   Macular hole of right eye 01/28/2020   Retinal detachment of right eye with single break 01/28/2020   Posterior vitreous detachment of left eye 01/28/2020   Diabetes mellitus without complication (HCC) 01/28/2020   Unilateral primary osteoarthritis, right hip 08/21/2019   Unilateral primary osteoarthritis, left hip 03/09/2017    History reviewed. No pertinent surgical history.     No family history on file.  Social History   Tobacco Use   Smoking status: Never   Smokeless tobacco: Never  Vaping Use   Vaping Use: Never used  Substance Use Topics   Alcohol use: No   Drug use: No    Home Medications Prior to Admission medications   Medication Sig Start Date End Date Taking? Authorizing Provider  BLACK CURRANT SEED OIL PO Take 1 capsule by mouth daily.    [provider]  Coenzyme Q10 (COQ10 PO) Take 1 tablet by mouth daily.     [provider]  Cyanocobalamin (VITAMIN B-12 PO) Take 1 tablet by mouth daily.    [provider]  cyclobenzaprine (FLEXERIL) 10 MG tablet Take 1 tablet (10 mg total) by mouth at bedtime. Patient not taking: Reported on 08/11/2018 08/08/17   Kirtland Bouchard, PA-C  losartan-hydrochlorothiazide (HYZAAR) 100-25 MG tablet Take 1 tablet by mouth daily. 07/30/18   [provider]  MAGNESIUM PO Take 1 tablet by mouth daily.    [provider]  metFORMIN (GLUCOPHAGE) 500 MG tablet Take by mouth 2 (two) times daily with a meal.    [provider]  polyvinyl alcohol (LIQUIFILM TEARS) 1.4 % ophthalmic solution Place 1 drop into both eyes as needed for dry eyes.    [provider]  traMADol (ULTRAM) 50 MG tablet Take 1-2 tablets (50-100 mg total) by mouth every 6 (six) hours as needed. 08/23/19   Kathryne Hitch, MD    Allergies    Iodine  Review of Systems   Review of Systems  Constitutional:  Negative for chills and fever.  HENT:  Negative for congestion.   Respiratory:  Positive for shortness of breath. Negative for cough.   Cardiovascular:  Positive for chest pain.  Gastrointestinal:  Positive for nausea. Negative for abdominal pain and vomiting.  Neurological:  Positive for dizziness and light-headedness. Negative for headaches.  All other systems reviewed and are negative.  Physical Exam  Updated Vital Signs BP (!) 143/64   Pulse (!) 53   Resp 16   Ht 5\' 10"  (1.778 m)   Wt 93.9 kg   SpO2 98%   BMI 29.70 kg/m   Physical Exam Vitals and nursing note reviewed.  Constitutional:      Appearance: Normal appearance.  HENT:     Head: Normocephalic and atraumatic.  Eyes:     Conjunctiva/sclera: Conjunctivae normal.  Cardiovascular:     Rate and Rhythm: Normal rate and regular rhythm.  Pulmonary:     Effort: Pulmonary effort is normal. No respiratory distress.     Breath sounds: Normal breath sounds.  Abdominal:     General:  There is no distension.     Palpations: Abdomen is soft.     Tenderness: There is no abdominal tenderness.  Skin:    General: Skin is warm and dry.  Neurological:     General: No focal deficit present.     Mental Status: He is alert.    ED Results / Procedures / Treatments   Labs (all labs ordered are listed, but only abnormal results are displayed) Labs Reviewed  CBC WITH DIFFERENTIAL/PLATELET - Abnormal; Notable for the following components:      Result Value   Hemoglobin 12.3 (*)    HCT 36.9 (*)    Neutro Abs 7.9 (*)    All other components within normal limits  COMPREHENSIVE METABOLIC PANEL - Abnormal; Notable for the following components:   Glucose, Bld 126 (*)    AST 60 (*)    ALT 65 (*)    All other components within normal limits  CBG MONITORING, ED - Abnormal; Notable for the following components:   Glucose-Capillary 154 (*)    All other components within normal limits  TROPONIN I (HIGH SENSITIVITY)  TROPONIN I (HIGH SENSITIVITY)    EKG None  Radiology No results found.  Procedures Procedures   Medications Ordered in ED Medications - No data to display  ED Course  I have reviewed the triage vital signs and the nursing notes.  Pertinent labs & imaging results that were available during my care of the patient were reviewed by me and considered in my medical decision making (see chart for details).    MDM Rules/Calculators/A&P                           Patient is 70 y/o male with history of T2DM on metformin who presents after episode of hypoglycemia today with associated light headedness, dizziness, chest pressure, and SOB. Initial BG was in the 50's. Patient ate food and called EMS. Patient never had such severe symptoms with low blood sugar.  Workup for sudden onset of lightheadedness and weakness. Lab workup and EKG unremarkable for cause of his weakness. Initial troponin is 8, repeat troponin 9.  On reevaluation patient continues to be symptom free  and feeling well. Discussed lab results. I do not feel the patient requires admission or inpatient treatment at this time. Plan to d/c to home and follow up with PCP for diabetes management. Patient agreeable to plan.  Final Clinical Impression(s) / ED Diagnoses Final diagnoses:  Hypoglycemia    Rx / DC Orders ED Discharge Orders     None        73, PA-C 01/20/21 1455    01/22/21, MD 01/20/21 1544

## 2021-01-20 NOTE — Discharge Instructions (Signed)
Your workup today was reassuring. I don't see any acute cause for your symptoms today. Continue to check your blood sugar regularly and maintain a regular eating schedule. I recommend following up with your primary care provider for long term diabetes management.  Return to ED for new or worsening dizziness, weakness, chest pain or difficulty breathing.

## 2021-01-28 ENCOUNTER — Encounter (INDEPENDENT_AMBULATORY_CARE_PROVIDER_SITE_OTHER): Payer: Medicare Other | Admitting: Ophthalmology

## 2021-02-01 ENCOUNTER — Encounter: Payer: Self-pay | Admitting: Physician Assistant

## 2021-02-01 ENCOUNTER — Ambulatory Visit (INDEPENDENT_AMBULATORY_CARE_PROVIDER_SITE_OTHER): Payer: Medicare Other | Admitting: Physician Assistant

## 2021-02-01 ENCOUNTER — Other Ambulatory Visit: Payer: Self-pay

## 2021-02-01 DIAGNOSIS — M1611 Unilateral primary osteoarthritis, right hip: Secondary | ICD-10-CM

## 2021-02-01 DIAGNOSIS — M1612 Unilateral primary osteoarthritis, left hip: Secondary | ICD-10-CM

## 2021-02-01 NOTE — Progress Notes (Signed)
HPI: Mr. Honse comes in today for bilateral hip pain.  He still having a lot of hip pain mainly in the right hip.  He has had no new falls or injuries.  He states that the injections in both hips intra-articularly helped for some time.  He does take some tramadol occasionally for pain.  States pain comes and goes.  He has been back to playing some golf and feels like this is aggravated particularly the right hip.  He reports being prediabetic.  Denies any fevers or chills.  Denies any ongoing infection.  Denies chest pain or shortness of breath.  He is wishing to proceed with a right total hip arthroplasty in the future with Dr. Magnus Ivan.  He has discussed direct anterior hip surgery with Dr. Magnus Ivan in the past.  He is also considering possible repeat injection in the left hip but states that left hips not bothering him such that he would want to have this done as of today.  Radiographs in the past were shown significant arthritic changes with periarticular osteophytes and joint space narrowing in both hips.  Review of systems: Please see HPI otherwise negative or noncontributory.  Physical exam General well-developed well-nourished male no acute distress mood and affect appropriate. Psych: Alert and oriented x3. Bilateral hips: He has stiffness with internal and external rotation of both hips.  Slight discomfort with internal rotation of the right hip.  Impression: Bilateral hip osteoarthritis  Plan: Discussed repeat injections patient would like to possibly have a left hip injection in the future.  Currently he is wishing to proceed with a right total hip arthroplasty sometime in December or early 2023.  He is given Domenica Reamer card and he will call and schedule surgery with her.  Discussed with him risk benefits of surgery.  Risk include but are not limited to DVT/PE, wound healing problems, infection, nerve vessel injury, prolonged pain worsening pain.  Questions were encouraged and answered at  length.

## 2021-02-03 ENCOUNTER — Ambulatory Visit (INDEPENDENT_AMBULATORY_CARE_PROVIDER_SITE_OTHER): Payer: Medicare Other | Admitting: Ophthalmology

## 2021-02-03 ENCOUNTER — Encounter (INDEPENDENT_AMBULATORY_CARE_PROVIDER_SITE_OTHER): Payer: Self-pay | Admitting: Ophthalmology

## 2021-02-03 ENCOUNTER — Other Ambulatory Visit: Payer: Self-pay

## 2021-02-03 DIAGNOSIS — E119 Type 2 diabetes mellitus without complications: Secondary | ICD-10-CM

## 2021-02-03 DIAGNOSIS — H35341 Macular cyst, hole, or pseudohole, right eye: Secondary | ICD-10-CM | POA: Diagnosis not present

## 2021-02-03 DIAGNOSIS — H43812 Vitreous degeneration, left eye: Secondary | ICD-10-CM

## 2021-02-03 DIAGNOSIS — H2512 Age-related nuclear cataract, left eye: Secondary | ICD-10-CM

## 2021-02-03 DIAGNOSIS — H33011 Retinal detachment with single break, right eye: Secondary | ICD-10-CM | POA: Diagnosis not present

## 2021-02-03 NOTE — Assessment & Plan Note (Signed)
No detectable diabetic retinopathy 

## 2021-02-03 NOTE — Assessment & Plan Note (Signed)
Chronic not treatable

## 2021-02-03 NOTE — Assessment & Plan Note (Signed)
Physiologic no retinal breaks peripherally

## 2021-02-03 NOTE — Progress Notes (Signed)
02/03/2021     CHIEF COMPLAINT Patient presents for  Chief Complaint  Patient presents with   Diabetic Eye Exam   Retina Follow Up      HISTORY OF PRESENT ILLNESS: Frederick Harris is a 70 y.o. male who presents to the clinic today for:   HPI     Diabetic Eye Exam   Vision is stable.  Associated Symptoms Floaters.  Negative for Flashes, Distortion, Blind Spot and Photophobia.  Diabetes characteristics include Type 2.  This started 3 years ago.  Blood sugar level is controlled.  Last Blood Glucose 126 (Checked this morning).  Last A1C 6.6 (09/2020).  I, the attending physician,  performed the HPI with the patient and updated documentation appropriately.        Retina Follow Up   Patient presents with  Other (Pterygium of left eye   Macular hole of right eye  Retinal detachment of right eye with single break  Posterior vitreous detachment of left eye per Dr Luciana Axe last visit dx.   ).  In both eyes.  This started 1 year ago.  Duration of 1 year.  Since onset it is stable.        Comments   1 yr fu ou FP. Patient states vision is stable and unchanged since last visit. Denies any new floaters or FOL. Pt states "I haven't paid attention to the floaters in a while, so I guess they went away." Pt states he uses OTC AT's as needed in both eyes.      Last edited by Nelva Nay on 02/03/2021  8:54 AM.      Referring physician: Maye Hides, PA [phone] [place],  [place] [address]  HISTORICAL INFORMATION:   Selected notes from the MEDICAL RECORD NUMBER       CURRENT MEDICATIONS: Current Outpatient Medications (Ophthalmic Drugs)  Medication Sig   polyvinyl alcohol (LIQUIFILM TEARS) 1.4 % ophthalmic solution Place 1 drop into both eyes as needed for dry eyes.   No current facility-administered medications for this visit. (Ophthalmic Drugs)   Current Outpatient Medications (Other)  Medication Sig   BLACK CURRANT SEED OIL PO Take 1 capsule by mouth daily.    Coenzyme Q10 (COQ10 PO) Take 1 tablet by mouth daily.   Cyanocobalamin (VITAMIN B-12 PO) Take 1 tablet by mouth daily.   cyclobenzaprine (FLEXERIL) 10 MG tablet Take 1 tablet (10 mg total) by mouth at bedtime. (Patient not taking: Reported on 08/11/2018)   losartan-hydrochlorothiazide (HYZAAR) 100-25 MG tablet Take 1 tablet by mouth daily.   MAGNESIUM PO Take 1 tablet by mouth daily.   metFORMIN (GLUCOPHAGE) 500 MG tablet Take by mouth 2 (two) times daily with a meal.   traMADol (ULTRAM) 50 MG tablet Take 1-2 tablets (50-100 mg total) by mouth every 6 (six) hours as needed.   No current facility-administered medications for this visit. (Other)      REVIEW OF SYSTEMS:    ALLERGIES Allergies  Allergen Reactions   Iodine    Shellfish Allergy Hives    Itching, swelling    PAST MEDICAL HISTORY Past Medical History:  Diagnosis Date   Diabetes mellitus without complication (HCC)    Hypertension    History reviewed. No pertinent surgical history.  FAMILY HISTORY History reviewed. No pertinent family history.  SOCIAL HISTORY Social History   Tobacco Use   Smoking status: Never   Smokeless tobacco: Never  Vaping Use   Vaping Use: Never used  Substance Use  Topics   Alcohol use: No   Drug use: No         OPHTHALMIC EXAM:  Base Eye Exam     Visual Acuity (Snellen - Linear)       Right Left   Dist Iselin 20/400 20/25 -1+2   Dist ph Blanket 20/100 -1          Tonometry (Tonopen, 8:59 AM)       Right Left   Pressure 13 17         Pupils       Dark Light React APD   Right 4 4 slightly irregular None   Left 4 3 Brisk None         Visual Fields (Counting fingers)       Left Right    Full    Restrictions  Partial outer superior temporal deficiency         Extraocular Movement       Right Left     Full    -- -- --  --  --  -- -- --   -- -- --  --  --  -- -- --           Neuro/Psych     Oriented x3: Yes   Mood/Affect: Normal          Dilation     Both eyes: 1.0% Mydriacyl, 2.5% Phenylephrine @ 8:59 AM           Slit Lamp and Fundus Exam     External Exam       Right Left   External Normal Normal         Slit Lamp Exam       Right Left   Lids/Lashes Normal Normal   Conjunctiva/Sclera White and quiet White and quiet   Cornea Clear Clear   Anterior Chamber Deep and quiet Deep and quiet   Iris Round and reactive Round and reactive   Lens Centered posterior chamber intraocular lens 2+ Nuclear sclerosis   Anterior Vitreous Normal Normal         Fundus Exam       Right Left   Posterior Vitreous Clear, vitrectomized Normal   Disc Normal Normal   C/D Ratio .6 .6   Macula Chronic macular hole, 800 m, stable Normal   Vessels Normal NO DR   Periphery Good buckle 360, No holes or tears No holes or tears            IMAGING AND PROCEDURES  Imaging and Procedures for 02/03/21  OCT, Retina - OU - Both Eyes       Right Eye Quality was good. Scan locations included subfoveal. Central Foveal Thickness: 309. Progression has been stable. Findings include macular hole.   Left Eye Quality was good. Scan locations included subfoveal. Central Foveal Thickness: 267. Progression has been stable. Findings include normal foveal contour.   Notes OS with PVD  OD with chronic macular hole, atrophic, not resectable     Color Fundus Photography Optos - OU - Both Eyes       Right Eye Progression has been stable. Disc findings include pallor. Macula : normal observations, macular hole. Vessels : normal observations.   Left Eye Progression has been stable. Disc findings include normal observations. Macula : normal observations. Vessels : normal observations. Periphery : normal observations.   Notes Macular hole OD stable, good scleral buckle, retina attached good chorioretinal scars  OS clear media and normal periphery  ASSESSMENT/PLAN:  Posterior vitreous detachment of left  eye Physiologic no retinal breaks peripherally  Diabetes mellitus without complication (HCC) No detectable diabetic retinopathy  Macular hole of right eye Chronic not treatable  Nuclear sclerotic cataract of left eye Mild cataract symptoms no impact on acuity OS will observe     ICD-10-CM   1. Macular hole of right eye  H35.341 OCT, Retina - OU - Both Eyes    Color Fundus Photography Optos - OU - Both Eyes    2. Retinal detachment of right eye with single break  H33.011     3. Posterior vitreous detachment of left eye  H43.812     4. Diabetes mellitus without complication (HCC)  E11.9     5. Nuclear sclerotic cataract of left eye  H25.12       1.  OD stable overall with a history of prior retinal detachment successfully repaired.  Late onset macular hole formation large, not treatable  2.  OS with minor cataract  3.  OU with no detectable diabetic retinopathy continue to observe  Ophthalmic Meds Ordered this visit:  No orders of the defined types were placed in this encounter.      Return in about 1 year (around 02/03/2022) for DILATE OU, COLOR FP, OCT.  There are no Patient Instructions on file for this visit.   Explained the diagnoses, plan, and follow up with the patient and they expressed understanding.  Patient expressed understanding of the importance of proper follow up care.   [facility] [place] Cherlyn Syring M.D. Diseases & Surgery of the Retina and Vitreous [facility] 02/03/21     Abbreviations: M myopia (nearsighted); A astigmatism; H hyperopia (farsighted); P presbyopia; Mrx spectacle prescription;  CTL contact lenses; OD right eye; OS left eye; OU both eyes  XT exotropia; ET esotropia; PEK punctate epithelial keratitis; PEE punctate epithelial erosions; DES dry eye syndrome; MGD meibomian gland dysfunction; ATs artificial tears; PFAT's preservative free artificial tears; NSC nuclear sclerotic cataract; PSC posterior subcapsular cataract; ERM  epi-retinal membrane; PVD posterior vitreous detachment; RD retinal detachment; DM diabetes mellitus; DR diabetic retinopathy; NPDR non-proliferative diabetic retinopathy; PDR proliferative diabetic retinopathy; CSME clinically significant macular edema; DME diabetic macular edema; dbh dot blot hemorrhages; CWS cotton wool spot; POAG primary open angle glaucoma; C/D cup-to-disc ratio; HVF humphrey visual field; GVF goldmann visual field; OCT optical coherence tomography; IOP intraocular pressure; BRVO Branch retinal vein occlusion; CRVO central retinal vein occlusion; CRAO central retinal artery occlusion; BRAO branch retinal artery occlusion; RT retinal tear; SB scleral buckle; PPV pars plana vitrectomy; VH Vitreous hemorrhage; PRP panretinal laser photocoagulation; IVK intravitreal kenalog; VMT vitreomacular traction; MH Macular hole;  NVD neovascularization of the disc; NVE neovascularization elsewhere; AREDS age related eye disease study; ARMD age related macular degeneration; POAG primary open angle glaucoma; EBMD epithelial/anterior basement membrane dystrophy; ACIOL anterior chamber intraocular lens; IOL intraocular lens; PCIOL posterior chamber intraocular lens; Phaco/IOL phacoemulsification with intraocular lens placement; PRK photorefractive keratectomy; LASIK laser assisted in situ keratomileusis; HTN hypertension; DM diabetes mellitus; COPD chronic obstructive pulmonary disease

## 2021-02-03 NOTE — Assessment & Plan Note (Signed)
Mild cataract symptoms no impact on acuity OS will observe

## 2021-11-08 ENCOUNTER — Ambulatory Visit: Payer: No Typology Code available for payment source | Admitting: Physician Assistant

## 2021-11-15 ENCOUNTER — Encounter: Payer: Self-pay | Admitting: Physician Assistant

## 2021-11-15 ENCOUNTER — Ambulatory Visit (INDEPENDENT_AMBULATORY_CARE_PROVIDER_SITE_OTHER): Payer: Medicare Other | Admitting: Physician Assistant

## 2021-11-15 VITALS — Ht 69.5 in | Wt 221.4 lb

## 2021-11-15 DIAGNOSIS — M1611 Unilateral primary osteoarthritis, right hip: Secondary | ICD-10-CM

## 2021-11-15 DIAGNOSIS — M5416 Radiculopathy, lumbar region: Secondary | ICD-10-CM | POA: Diagnosis not present

## 2021-11-15 NOTE — Progress Notes (Signed)
HPI: [name] comes in today for right hip pain.  He was last seen 02/01/2021 and at that time was thinking about undergoing right total hip arthroplasty due to his right hip arthritis.  Comes in today requesting a intra-articular injection in the right hip.  Last hip injections were performed on 08/12/2020 this is a bilateral intra-articular injections in these helped for few months.  He states he mostly has right hip pain minimal left hip pain.  Right hip pain is 8 out of 10 at worst.  Deep within the groin.  He also has some what he describes as stiffness in his low back and very rare radicular symptoms down the left leg.  He denies any waking pain due to his back, bowel or bladder dysfunction, or saddle anesthesia like symptoms.  He is asking if he could go to therapy for his back.  Review of systems: See HPI otherwise negative  Physical exam: General well-developed well-nourished male who walks with a nonantalgic gait and no assistive device. Lumbar spine: Negative straight leg raise bilaterally.  Has full forward flexion very limited extension due to low back pain.  Bilateral hips: Left hip good range of motion without pain.  Right hip pain with internal rotation.  Impression: Right hip osteoarthritis Lumbar radiculopathy  Plan: Recommend physical therapy for his back to work on range of motion, stretching, strengthening of his core, home exercise and modalities. In regards to his hip we will set him up for an intra-articular injection of the right hip.  He will follow-up with [place] as needed.  Questions encouraged and answered.

## 2021-11-15 NOTE — Addendum Note (Signed)
Addended by: Barbette Or on: 11/15/2021 09:17 AM   Modules accepted: Orders

## 2021-11-17 ENCOUNTER — Ambulatory Visit: Payer: Medicare Other | Attending: Physician Assistant | Admitting: Physical Therapy

## 2021-11-17 ENCOUNTER — Encounter: Payer: Self-pay | Admitting: Physical Therapy

## 2021-11-17 DIAGNOSIS — M25551 Pain in right hip: Secondary | ICD-10-CM | POA: Diagnosis present

## 2021-11-17 DIAGNOSIS — M25651 Stiffness of right hip, not elsewhere classified: Secondary | ICD-10-CM

## 2021-11-17 DIAGNOSIS — M6281 Muscle weakness (generalized): Secondary | ICD-10-CM

## 2021-11-17 DIAGNOSIS — R293 Abnormal posture: Secondary | ICD-10-CM

## 2021-11-17 DIAGNOSIS — M5459 Other low back pain: Secondary | ICD-10-CM

## 2021-11-17 DIAGNOSIS — M25652 Stiffness of left hip, not elsewhere classified: Secondary | ICD-10-CM

## 2021-11-17 DIAGNOSIS — M5416 Radiculopathy, lumbar region: Secondary | ICD-10-CM | POA: Diagnosis not present

## 2021-11-17 NOTE — Therapy (Signed)
OUTPATIENT PHYSICAL THERAPY THORACOLUMBAR EVALUATION   Patient Name: Frederick Harris MRN: [id number] DOB:1950-12-26, 70 y.o., male Today's Date: 11/17/2021   PT End of Session - 11/17/21 1025     Visit Number 1    Number of Visits 12    Date for PT Re-Evaluation 12/29/21    PT Start Time 1020    PT Stop Time 1100    PT Time Calculation (min) 40 min    Activity Tolerance Patient tolerated treatment well    Behavior During Therapy WFL for tasks assessed/performed             Past Medical History:  Diagnosis Date   Diabetes mellitus without complication (HCC)    Hypertension    History reviewed. No pertinent surgical history. Patient Active Problem List   Diagnosis Date Noted   Nuclear sclerotic cataract of left eye 02/03/2021   Pterygium of left eye 01/28/2020   Macular hole of right eye 01/28/2020   Retinal detachment of right eye with single break 01/28/2020   Posterior vitreous detachment of left eye 01/28/2020   Diabetes mellitus without complication (HCC) 01/28/2020   Unilateral primary osteoarthritis, right hip 08/21/2019   Unilateral primary osteoarthritis, left hip 03/09/2017    PCP: Maye Hides, PA  REFERRING PROVIDER: Kirtland Bouchard, PA-C  REFERRING DIAG: [phone] (ICD-10-CM) - Radiculopathy, lumbar region  Rationale for Evaluation and Treatment Rehabilitation  THERAPY DIAG:  Other low back pain  Stiffness of left hip, not elsewhere classified  Stiffness of right hip, not elsewhere classified  Pain in right hip  Muscle weakness (generalized)  Abnormal posture  ONSET DATE: 5 years ago  SUBJECTIVE:                                                                                                                                                                                           SUBJECTIVE STATEMENT: Patient reports a lot of stiffness in his low back.  He pulled out a washer ~ 5 years ago and hurt his back, since then has noted it  stiffening up.  He has history of hip pain, R hip mostly bothers him, getting another shot Friday.  He reports his Left leg feels a lot weaker than his R leg with exercises.  He denies falls but notes having to pay attention to his L foot.  He still plays golf but swings with his arms.   PERTINENT HISTORY:  Bil hip OA, T2DM  PAIN:  Are you having pain? Yes: NPRS scale: 0/10 Pain location: low back, R hip 6/10  Pain description: tightness Aggravating factors: uneven surfaces Relieving factors: exercises  PRECAUTIONS: None  WEIGHT BEARING RESTRICTIONS No  FALLS:  Has patient fallen in last 6 months? No and but has to pay attention picking up feet due to posture.    LIVING ENVIRONMENT: Lives with: lives with their spouse Lives in: House/apartment Stairs: Yes: External: 2 steps; none Has following equipment at home: None  OCCUPATION: part-time at [employer] course in [employer]   PLOF: Independent tries to exercise legs daily, plays golf.   PATIENT GOALS improve posture and strength, decrease stiffness.    OBJECTIVE:   DIAGNOSTIC FINDINGS:  Xray lumbar spine from 2019: Lumbar spine 2 views.  No acute fractures.  Decreased disc space at L3-4  L4-5 and L5-S1 with plate spurring near fusion of the L4-5 osteophytes.    Loss of lordotic curvature.  No spondylolisthesis.  Slight scoliosis.  Xray hips from 2021 A low AP pelvis and lateral of the right hip shows both hips having significant arthritic findings with para-articular osteophytes and  significant joint space narrowing.  There is otherwise no acute findings. PATIENT SURVEYS:  Modified Oswestry 7/50 = 14% minimal disability due to Low back pain    LEFS   SCREENING FOR RED FLAGS: Bowel or bladder incontinence: No Spinal tumors: No Cauda equina syndrome: No Compression fracture: No Abdominal aneurysm: No  COGNITION:  Overall cognitive status: Within functional limits for tasks  assessed     SENSATION: WFL  MUSCLE LENGTH: Hamstrings : Right 90 deg; Left 90 deg ely's test: Right 90 deg; Left 80 deg  POSTURE: flexed trunk   PALPATION: Hypomobility throughout lumbar spine, reports tightness but no pain with palpation of lumbar paraspinals, glutes and SIJ.  Tenderness R QL.   LUMBAR ROM:   Active  A/PROM  eval  Flexion To toes  Extension To neutral*  Right lateral flexion Mid thigh *  Left lateral flexion Mid thigh *  Right rotation 90% limited  Left rotation 90% limited   (Blank rows = not tested)  LOWER EXTREMITY ROM:     Passive  Right eval Left eval  Hip flexion 15 20  Hip extension    Hip abduction    Hip adduction    Hip internal rotation 15 15  Hip external rotation 16 20  Knee flexion    Knee extension    Ankle dorsiflexion (straight knee/bent knee) 5/10 7/4  Ankle plantarflexion    Ankle inversion    Ankle eversion     (Blank rows = not tested)  LOWER EXTREMITY MMT:    MMT Right eval Left eval  Hip flexion 4 in available ROM 4  Hip extension 3+ 3  Hip abduction 4 4  Hip adduction 5 5  Hip internal rotation    Hip external rotation    Knee flexion 4 2+  Knee extension 5 5  Ankle dorsiflexion 5 3+  Ankle plantarflexion    Ankle inversion    Ankle eversion     (Blank rows = not tested)  LUMBAR SPECIAL TESTS:  Straight leg raise test: Negative and FABER test: lacking hip ROM to perform  FUNCTIONAL TESTS:  5 times sit to stand: 15.5 sec  GAIT: Distance walked: 50 Assistive device utilized: None Level of assistance: Complete Independence Comments: visually slow, flexed posture, decreased heel strike, decreased hip extension, L foot slap    TODAY'S TREATMENT  11/17/2021 Self Care: see patient education.   Prone x 3 min, followed by prone knee bends x 10 R.  Unable to perform on L even  with tactile cues.   PATIENT EDUCATION:  Education details: findings, POC, initial HEP Person educated: Patient Education  method: Programmer, multimedia, Demonstration, Verbal cues, and Handouts Education comprehension: verbalized understanding and returned demonstration   HOME EXERCISE PROGRAM: Access Code: [email or web address]  ASSESSMENT:  CLINICAL IMPRESSION: Patient is a 71 y.o. male who was seen today for physical therapy evaluation and treatment for lumbar radiculopathy.  He reports primarily tightness but no pain in his low back.  However, he has bil hip OA, R hip pain and LLE weakness, significant impairment in bil hip ROM, and impaired posture.  He stands/walks with bil hip flexion, and with decreased ankle DF on L and weakness, difficulty clearing L foot during gait causing increased risk of falls.  He would benefit from skilled physical therapy to address above impairments in order to improve posture, gait, and mobility.  Today he was given initial HEP just laying in prone 3-5 min for passive hip flexor stretch, and prone knee bends for quad stretch.  Noted today unable to perform prone knee bends on L due to hamstring weakness as well.     OBJECTIVE IMPAIRMENTS Abnormal gait, decreased activity tolerance, decreased endurance, decreased mobility, difficulty walking, decreased ROM, decreased strength, hypomobility, increased fascial restrictions, increased muscle spasms, impaired flexibility, postural dysfunction, and pain.   ACTIVITY LIMITATIONS bending, standing, squatting, stairs, and locomotion level  PARTICIPATION LIMITATIONS: shopping, community activity, occupation, and yard work  PERSONAL FACTORS 1-2 comorbidities: bil hip OA and pain, T2DM  are also affecting patient's functional outcome.   REHAB POTENTIAL: Fair due to hip OA  CLINICAL DECISION MAKING: Evolving/moderate complexity  EVALUATION COMPLEXITY: Moderate   GOALS: Goals reviewed with patient? Yes  SHORT TERM GOALS: Target date: 12/01/2021   Patient will be independent with initial HEP.  Baseline: given Goal status: INITIAL  2.  Patient will  complete LEFS or FOTO Baseline: not given Goal status: INITIAL   LONG TERM GOALS: Target date: 12/29/2021   Patient will be independent with advanced/ongoing HEP to improve outcomes and carryover.  Baseline: needs progression Goal status: INITIAL  2.  Patient will report 75% improvement in low back tightness to improve QOL.  Baseline: 6/10 tightness, but no pain Goal status: INITIAL  3.  Patient to demonstrate ability to achieve and maintain good spinal alignment/posturing and body mechanics needed for daily activities. Baseline: forward flexed posture Goal status: INITIAL  4.  Patient will demonstrate full pain free lumbar ROM to perform ADLs.   Baseline: see objective, very limited Goal status: INITIAL  5.  Patient will demonstrate improved LE strength as demonstrated by 4+/5 LLE strength. Baseline: see objective, weakness LLE Goal status: INITIAL  6.  Patient will report MCID on LEFS or FOTO to demonstrate improved functional ability.  Baseline: TBD Goal status: INITIAL   7.  Patient will demonstrate upright posture with gait. Baseline: forward flexed posture  Goal status: INITIAL   PLAN: PT FREQUENCY: 2x/week  PT DURATION: 6 weeks  PLANNED INTERVENTIONS: Therapeutic exercises, Therapeutic activity, Neuromuscular re-education, Balance training, Gait training, Patient/Family education, Joint mobilization, Stair training, Dry Needling, Electrical stimulation, Spinal mobilization, Cryotherapy, Moist heat, Traction, Ultrasound, and Manual therapy.  PLAN FOR NEXT SESSION: progress postural exercises and hip stretches, FOTO, LEFS, manual therapy, modalities PRN   Jena Gauss, PT, DPT 11/17/2021, 12:20 PM

## 2021-11-19 ENCOUNTER — Encounter: Payer: Self-pay | Admitting: Physical Therapy

## 2021-11-19 ENCOUNTER — Ambulatory Visit: Payer: Medicare Other | Admitting: Physical Therapy

## 2021-11-19 DIAGNOSIS — M25651 Stiffness of right hip, not elsewhere classified: Secondary | ICD-10-CM

## 2021-11-19 DIAGNOSIS — R293 Abnormal posture: Secondary | ICD-10-CM

## 2021-11-19 DIAGNOSIS — M5459 Other low back pain: Secondary | ICD-10-CM | POA: Diagnosis not present

## 2021-11-19 DIAGNOSIS — M25551 Pain in right hip: Secondary | ICD-10-CM

## 2021-11-19 DIAGNOSIS — M6281 Muscle weakness (generalized): Secondary | ICD-10-CM

## 2021-11-19 DIAGNOSIS — M25652 Stiffness of left hip, not elsewhere classified: Secondary | ICD-10-CM

## 2021-11-19 NOTE — Therapy (Signed)
OUTPATIENT PHYSICAL THERAPY Treatment Note   Patient Name: Frederick Harris MRN: [id number] DOB:[birth year], 71 y.o., male Today's Date: 11/19/2021   PT End of Session - 11/19/21 0849     Visit Number 2    Number of Visits 12    Date for PT Re-Evaluation 12/29/21    Progress Note Due on Visit 10    PT Start Time 0848    PT Stop Time 0932    PT Time Calculation (min) 44 min    Activity Tolerance Patient tolerated treatment well    Behavior During Therapy [facility] for tasks assessed/performed             Past Medical History:  Diagnosis Date   Diabetes mellitus without complication ([place])    Hypertension    History reviewed. No pertinent surgical history. Patient Active Problem List   Diagnosis Date Noted   Nuclear sclerotic cataract of left eye 02/03/2021   Pterygium of left eye 01/28/2020   Macular hole of right eye 01/28/2020   Retinal detachment of right eye with single break 01/28/2020   Posterior vitreous detachment of left eye 01/28/2020   Diabetes mellitus without complication ([place]) 81/27/5170   Unilateral primary osteoarthritis, right hip 08/21/2019   Unilateral primary osteoarthritis, left hip 03/09/2017    PCP: Penni Bombard, PA  REFERRING PROVIDER: Pete Pelt, PA-C  REFERRING DIAG: [phone] (ICD-10-CM) - Radiculopathy, lumbar region  Rationale for Evaluation and Treatment Rehabilitation  THERAPY DIAG:  Other low back pain  Stiffness of right hip, not elsewhere classified  Pain in right hip  Stiffness of left hip, not elsewhere classified  Muscle weakness (generalized)  Abnormal posture  ONSET DATE: 5 years ago  SUBJECTIVE:                                                                                                                                                                                           SUBJECTIVE STATEMENT: Pt. Reports no changes since IE.  He has not had a chance to start HEP yet, since worked yesterday.   Scheduled for  injection R hip next Friday.    PERTINENT HISTORY:  Bil hip OA, T2DM  PAIN:  Are you having pain? Yes: NPRS scale: 5/10 Pain location: R hip Pain description: tightness Aggravating factors: uneven surfaces Relieving factors: exercises   PRECAUTIONS: None  WEIGHT BEARING RESTRICTIONS No  FALLS:  Has patient fallen in last 6 months? No and but has to pay attention picking up feet due to posture.    LIVING ENVIRONMENT: Lives with: lives with their spouse Lives in: House/apartment Stairs: Yes: External: 2 steps; none Has following  equipment at home: None  OCCUPATION: part-time at [employer] in [employer].   PLOF: Independent tries to exercise legs daily, plays golf.   PATIENT GOALS improve posture and strength, decrease stiffness.    OBJECTIVE:   DIAGNOSTIC FINDINGS:  Xray lumbar spine from 2019: Lumbar spine 2 views.  No acute fractures.  Decreased disc space at L3-4  L4-5 and L5-S1 with plate spurring near fusion of the L4-5 osteophytes.    Loss of lordotic curvature.  No spondylolisthesis.  Slight scoliosis.  Xray hips from 2021 A low AP pelvis and lateral of the right hip shows both hips having significant arthritic findings with para-articular osteophytes and  significant joint space narrowing.  There is otherwise no acute findings. PATIENT SURVEYS:  Modified Oswestry 7/50 = 14% minimal disability due to Low back pain    LEFS  - 39/80 = 49% disability due to hip pain  SCREENING FOR RED FLAGS: Bowel or bladder incontinence: No Spinal tumors: No Cauda equina syndrome: No Compression fracture: No Abdominal aneurysm: No  COGNITION:  Overall cognitive status: Within functional limits for tasks assessed     SENSATION: WFL  MUSCLE LENGTH: Hamstrings : Right 90 deg; Left 90 deg ely's test: Right 90 deg; Left 80 deg  POSTURE: flexed trunk   PALPATION: Hypomobility throughout lumbar spine, reports tightness but no pain with palpation of  lumbar paraspinals, glutes and SIJ.  Tenderness R QL.   LUMBAR ROM:   Active  A/PROM  eval  Flexion To toes  Extension To neutral*  Right lateral flexion Mid thigh *  Left lateral flexion Mid thigh *  Right rotation 90% limited  Left rotation 90% limited   (Blank rows = not tested)  LOWER EXTREMITY ROM:     Passive  Right eval Left eval  Hip flexion 15 20  Hip extension    Hip abduction    Hip adduction    Hip internal rotation 15 15  Hip external rotation 16 20  Knee flexion    Knee extension    Ankle dorsiflexion (straight knee/bent knee) 5/10 7/4  Ankle plantarflexion    Ankle inversion    Ankle eversion     (Blank rows = not tested)  LOWER EXTREMITY MMT:    MMT Right eval Left eval  Hip flexion 4 in available ROM 4  Hip extension 3+ 3  Hip abduction 4 4  Hip adduction 5 5  Hip internal rotation    Hip external rotation    Knee flexion 4 2+  Knee extension 5 5  Ankle dorsiflexion 5 3+  Ankle plantarflexion    Ankle inversion    Ankle eversion     (Blank rows = not tested)  LUMBAR SPECIAL TESTS:  Straight leg raise test: Negative and FABER test: lacking hip ROM to perform  FUNCTIONAL TESTS:  5 times sit to stand: 15.5 sec  GAIT: Distance walked: 50 Assistive device utilized: None Level of assistance: Complete Independence Comments: visually slow, flexed posture, decreased heel strike, decreased hip extension, L foot slap    TODAY'S TREATMENT  11/19/2021 Therapeutic Exercise: to improve strength and mobility.  Demo, verbal and tactile cues throughout for technique. Bike L1 x 6 min  LTR x 10 bil  Windshield Wipers x 10 bil SKTC stretch 2 x 30 sec bil  Hip flexor stretch supine RLE x 3 min  PPT x 20  Bridges x 10  Standing hip flexor stretch x 30 sec bil  Standing hip extension  with bil UE support on counter 2 x 10 bil  Gentle lumbar repeated extension on counter x 10   11/17/2021 Self Care: see patient education.   Prone x 3 min, followed  by prone knee bends x 10 R.  Unable to perform on L even with tactile cues.   PATIENT EDUCATION:  Education details: HEP update Person educated: Patient Education method: Explanation, Demonstration, Verbal cues, and Handouts Education comprehension: verbalized understanding and returned demonstration   HOME EXERCISE PROGRAM: Access Code: [email or web address]  ASSESSMENT:  CLINICAL IMPRESSION: Pt. Continues to demonstrate significant tightness in bil hips affecting low back.  Today worked on stretches to address hip/LB tightness and gentle core and glute strengthening.  Noted some difficulty with more prolonged stretches due to tightening in LB, but resolved with movement.  Overall tolerated exercises well and reported able to stand straighter at end of session.  LEFS done and patient reported 49% disability due to hip pain.  Flonnie Overman continues to demonstrate potential for improvement and would benefit from continued skilled therapy to address impairments.     OBJECTIVE IMPAIRMENTS Abnormal gait, decreased activity tolerance, decreased endurance, decreased mobility, difficulty walking, decreased ROM, decreased strength, hypomobility, increased fascial restrictions, increased muscle spasms, impaired flexibility, postural dysfunction, and pain.   ACTIVITY LIMITATIONS bending, standing, squatting, stairs, and locomotion level  PARTICIPATION LIMITATIONS: shopping, community activity, occupation, and yard work  PERSONAL FACTORS 1-2 comorbidities: bil hip OA and pain, T2DM  are also affecting patient's functional outcome.   REHAB POTENTIAL: Fair due to hip OA  CLINICAL DECISION MAKING: Evolving/moderate complexity  EVALUATION COMPLEXITY: Moderate   GOALS: Goals reviewed with patient? Yes  SHORT TERM GOALS: Target date: 12/01/2021   Patient will be independent with initial HEP.  Baseline: given Goal status: IN PROGRESS  2.  Patient will complete LEFS or FOTO Baseline: not given Goal status:  MET   LONG TERM GOALS: Target date: 12/29/2021   Patient will be independent with advanced/ongoing HEP to improve outcomes and carryover.  Baseline: needs progression Goal status: IN PROGRESS  2.  Patient will report 75% improvement in low back tightness to improve QOL.  Baseline: 6/10 tightness, but no pain Goal status: IN PROGRESS  3.  Patient to demonstrate ability to achieve and maintain good spinal alignment/posturing and body mechanics needed for daily activities. Baseline: forward flexed posture Goal status: IN PROGRESS  4.  Patient will demonstrate full pain free lumbar ROM to perform ADLs.   Baseline: see objective, very limited Goal status: IN PROGRESS  5.  Patient will demonstrate improved LE strength as demonstrated by 4+/5 LLE strength. Baseline: see objective, weakness LLE Goal status: IN PROGRESS  6.  Patient will report MCID (10% improvement) on LEFS demonstrate improved functional ability.  Baseline: 49% disability Goal status: IN PROGRESS   7.  Patient will demonstrate upright posture with gait. Baseline: forward flexed posture  Goal status: IN PROGRESS   PLAN: PT FREQUENCY: 2x/week  PT DURATION: 6 weeks  PLANNED INTERVENTIONS: Therapeutic exercises, Therapeutic activity, Neuromuscular re-education, Balance training, Gait training, Patient/Family education, Joint mobilization, Stair training, Dry Needling, Electrical stimulation, Spinal mobilization, Cryotherapy, Moist heat, Traction, Ultrasound, and Manual therapy.  PLAN FOR NEXT SESSION:review exercises, manual therapy to R hip to improve mobility, modalities PRN   Rennie Natter, PT, DPT 11/19/2021, 9:59 AM

## 2021-11-23 NOTE — Therapy (Signed)
OUTPATIENT PHYSICAL THERAPY Treatment Note   Patient Name: Frederick Harris MRN: [id number] DOB:[birth year], 71 y.o., male Today's Date: 11/24/2021   PT End of Session - 11/24/21 0847     Visit Number 3    Number of Visits 12    Date for PT Re-Evaluation 12/29/21    Progress Note Due on Visit 10    PT Start Time [phone]    PT Stop Time 0930    PT Time Calculation (min) 44 min    Activity Tolerance Patient tolerated treatment well    Behavior During Therapy [facility] for tasks assessed/performed              Past Medical History:  Diagnosis Date   Diabetes mellitus without complication ([place])    Hypertension    History reviewed. No pertinent surgical history. Patient Active Problem List   Diagnosis Date Noted   Nuclear sclerotic cataract of left eye 02/03/2021   Pterygium of left eye 01/28/2020   Macular hole of right eye 01/28/2020   Retinal detachment of right eye with single break 01/28/2020   Posterior vitreous detachment of left eye 01/28/2020   Diabetes mellitus without complication ([place]) 36/64/4034   Unilateral primary osteoarthritis, right hip 08/21/2019   Unilateral primary osteoarthritis, left hip 03/09/2017    PCP: Penni Bombard, PA  REFERRING PROVIDER: Pete Pelt, PA-C  REFERRING DIAG: [phone] (ICD-10-CM) - Radiculopathy, lumbar region  Rationale for Evaluation and Treatment Rehabilitation  THERAPY DIAG:  Other low back pain  Stiffness of right hip, not elsewhere classified  Pain in right hip  Stiffness of left hip, not elsewhere classified  Muscle weakness (generalized)  Abnormal posture  ONSET DATE: 5 years ago  SUBJECTIVE:                                                                                                                                                                                           SUBJECTIVE STATEMENT: Able to do some exercises.  Scheduled for injection R hip next Friday.    PERTINENT HISTORY:  Bil hip OA,  T2DM  PAIN:  Are you having pain?  Yes: NPRS scale: 7/10 Pain location: R hip Pain description: tightness Aggravating factors: uneven surfaces Relieving factors: exercises   PRECAUTIONS: None  WEIGHT BEARING RESTRICTIONS No  FALLS:  Has patient fallen in last 6 months? No and but has to pay attention picking up feet due to posture.    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   PLOF: Independent tries to exercise legs daily, plays golf.   PATIENT GOALS improve posture and strength, decrease stiffness.    OBJECTIVE:   DIAGNOSTIC FINDINGS:  Xray lumbar spine from 2019: Lumbar spine 2 views.  No acute fractures.  Decreased disc space at L3-4  L4-5 and L5-S1 with plate spurring near fusion of the L4-5 osteophytes.    Loss of lordotic curvature.  No spondylolisthesis.  Slight scoliosis.  Xray hips from 2021 A low AP pelvis and lateral of the right hip shows both hips having significant arthritic findings with para-articular osteophytes and  significant joint space narrowing.  There is otherwise no acute findings. PATIENT SURVEYS:  Modified Oswestry 7/50 = 14% minimal disability due to Low back pain    LEFS  - 39/80 = 49% disability due to hip pain  SCREENING FOR RED FLAGS: Bowel or bladder incontinence: No Spinal tumors: No Cauda equina syndrome: No Compression fracture: No Abdominal aneurysm: No  COGNITION:  Overall cognitive status: Within functional limits for tasks assessed     SENSATION: WFL  MUSCLE LENGTH: Hamstrings : Right 90 deg; Left 90 deg ely's test: Right 90 deg; Left 80 deg  POSTURE: flexed trunk   PALPATION: Hypomobility throughout lumbar spine, reports tightness but no pain with palpation of lumbar paraspinals, glutes and SIJ.  Tenderness R QL.   LUMBAR ROM:    Active  A/PROM  eval  Flexion To toes  Extension To neutral*  Right lateral flexion Mid thigh *  Left lateral flexion Mid thigh *  Right rotation 90% limited  Left rotation 90% limited   (Blank rows = not tested)  LOWER EXTREMITY ROM:     Passive  Right eval Left eval  Hip flexion 15 20  Hip extension    Hip abduction    Hip adduction    Hip internal rotation 15 15  Hip external rotation 16 20  Knee flexion    Knee extension    Ankle dorsiflexion (straight knee/bent knee) 5/10 7/4  Ankle plantarflexion    Ankle inversion    Ankle eversion     (Blank rows = not tested)  LOWER EXTREMITY MMT:    MMT Right eval Left eval  Hip flexion 4 in available ROM 4  Hip extension 3+ 3  Hip abduction 4 4  Hip adduction 5 5  Hip internal rotation    Hip external rotation    Knee flexion 4 2+  Knee extension 5 5  Ankle dorsiflexion 5 3+  Ankle plantarflexion    Ankle inversion    Ankle eversion     (Blank rows = not tested)  LUMBAR SPECIAL TESTS:  Straight leg raise test: Negative and FABER test: lacking hip ROM to perform  FUNCTIONAL TESTS:  5 times sit to stand: 15.5 sec  GAIT: Distance walked: 50 Assistive device utilized: None Level of assistance: Complete Independence Comments: visually slow, flexed posture, decreased heel strike, decreased hip extension, L foot slap    TODAY'S TREATMENT  11/24/21 Therapeutic Exercise: to improve strength and mobility.  Demo, verbal and tactile cues throughout for technique. Bike L4 x 6 min    Gastoc stretch 2x 30 sec bil Standing bil hip flexor stretch at wall dropping hips to wall 2x 20 sec LTR x 10 bil 10 sec hold Bridges x 10 Supine KTOS 2x 30 sec ea PPT x 20 Prone knee flexion R x 10, seated L knee flexion on slider x 10 Prone quad stretch with strap 2x30  sec ea  Manual: UPA mobs lumbar to decreased lumbar tightness; QL release bil in SDLY  11/19/2021 Therapeutic Exercise: to improve strength and mobility.  Demo,  verbal and tactile cues throughout for technique. Bike L1 x 6 min  LTR x 10 bil  Windshield Wipers x 10 bil SKTC stretch 2 x 30 sec bil  Hip flexor stretch supine RLE x 3 min  PPT x 20  Bridges x 10  Standing hip flexor stretch x 30 sec bil  Standing hip extension with bil UE support on counter 2 x 10 bil  Gentle lumbar repeated extension on counter x 10   11/17/2021 Self Care: see patient education.   Prone x 3 min, followed by prone knee bends x 10 R.  Unable to perform on L even with tactile cues.   PATIENT EDUCATION:  Education details: DN handout 11/24/21 Person educated: Patient Education method: Explanation and Handouts Education comprehension: verbalized understanding   HOME EXERCISE PROGRAM: Access Code: [email or web address]  ASSESSMENT:  CLINICAL IMPRESSION: Torrie reports he has been doing his stretches and reports he is standing up straighter since starting therapy. He responded well to MT today with decreased tissue tension after. Continue to do prone quad stretch in clinic as it is difficult for pt to do at home. Also provide h/o for calf stretch.   OBJECTIVE IMPAIRMENTS Abnormal gait, decreased activity tolerance, decreased endurance, decreased mobility, difficulty walking, decreased ROM, decreased strength, hypomobility, increased fascial restrictions, increased muscle spasms, impaired flexibility, postural dysfunction, and pain.   ACTIVITY LIMITATIONS bending, standing, squatting, stairs, and locomotion level  PARTICIPATION LIMITATIONS: shopping, community activity, occupation, and yard work  PERSONAL FACTORS 1-2 comorbidities: bil hip OA and pain, T2DM  are also affecting patient's functional outcome.   REHAB POTENTIAL: Fair due to hip OA  CLINICAL DECISION MAKING: Evolving/moderate complexity  EVALUATION COMPLEXITY: Moderate   GOALS: Goals reviewed with patient? Yes  SHORT TERM GOALS: Target date: 12/01/2021   Patient will be independent with initial HEP.   Baseline: given Goal status: IN PROGRESS  2.  Patient will complete LEFS or FOTO Baseline: not given Goal status: MET   LONG TERM GOALS: Target date: 12/29/2021   Patient will be independent with advanced/ongoing HEP to improve outcomes and carryover.  Baseline: needs progression Goal status: IN PROGRESS  2.  Patient will report 75% improvement in low back tightness to improve QOL.  Baseline: 6/10 tightness, but no pain Goal status: IN PROGRESS  3.  Patient to demonstrate ability to achieve and maintain good spinal alignment/posturing and body mechanics needed for daily activities. Baseline: forward flexed posture Goal status: IN PROGRESS  4.  Patient will demonstrate full pain free lumbar ROM to perform ADLs.   Baseline: see objective, very limited Goal status: IN PROGRESS  5.  Patient will demonstrate improved LE strength as demonstrated by 4+/5 LLE strength. Baseline: see objective, weakness LLE Goal status: IN PROGRESS  6.  Patient will report MCID (10% improvement) on LEFS demonstrate improved functional ability.  Baseline: 49% disability Goal status: IN PROGRESS   7.  Patient will demonstrate upright posture with gait. Baseline: forward flexed posture  Goal status: IN PROGRESS   PLAN: PT FREQUENCY: 2x/week  PT DURATION: 6 weeks  PLANNED INTERVENTIONS: Therapeutic exercises, Therapeutic activity, Neuromuscular re-education, Balance training, Gait training, Patient/Family education, Joint mobilization, Stair training, Dry Needling, Electrical stimulation, Spinal mobilization, Cryotherapy, Moist heat, Traction, Ultrasound, and Manual therapy.  PLAN FOR NEXT SESSION:   continue to focus on flexibility, manual therapy to   R hip and lumbar to improve mobility, modalities PRN   Arnola Crittendon, PT, DPT 11/24/2021, 9:34 AM

## 2021-11-24 ENCOUNTER — Encounter: Payer: Self-pay | Admitting: Physical Therapy

## 2021-11-24 ENCOUNTER — Ambulatory Visit: Payer: Medicare Other | Admitting: Physical Therapy

## 2021-11-24 DIAGNOSIS — R293 Abnormal posture: Secondary | ICD-10-CM

## 2021-11-24 DIAGNOSIS — M25651 Stiffness of right hip, not elsewhere classified: Secondary | ICD-10-CM

## 2021-11-24 DIAGNOSIS — M25551 Pain in right hip: Secondary | ICD-10-CM

## 2021-11-24 DIAGNOSIS — M5459 Other low back pain: Secondary | ICD-10-CM

## 2021-11-24 DIAGNOSIS — M25652 Stiffness of left hip, not elsewhere classified: Secondary | ICD-10-CM

## 2021-11-24 DIAGNOSIS — M6281 Muscle weakness (generalized): Secondary | ICD-10-CM

## 2021-11-26 ENCOUNTER — Ambulatory Visit (INDEPENDENT_AMBULATORY_CARE_PROVIDER_SITE_OTHER): Payer: Medicare Other | Admitting: Orthopaedic Surgery

## 2021-11-26 DIAGNOSIS — M25551 Pain in right hip: Secondary | ICD-10-CM

## 2021-11-26 DIAGNOSIS — M1611 Unilateral primary osteoarthritis, right hip: Secondary | ICD-10-CM

## 2021-11-26 MED ORDER — LIDOCAINE HCL 1 % IJ SOLN
4.0000 mL | INTRAMUSCULAR | Status: AC | PRN
  Administered 2021-11-26: 4 mL

## 2021-11-26 MED ORDER — TRIAMCINOLONE ACETONIDE 40 MG/ML IJ SUSP
80.0000 mg | INTRAMUSCULAR | Status: AC | PRN
  Administered 2021-11-26: 80 mg via INTRA_ARTICULAR

## 2021-11-26 NOTE — Progress Notes (Signed)
Chief Complaint: Right hip injection     History of Present Illness:    Frederick Harris is a 71 y.o. male presents today as a referral from [facility], [place] for injection of his right femoral acetabular joint.  He has known osteoarthritis of both hips.  This is been bothering him for several years.  He is considering arthroplasty in the past but has elected for right hip injection at today's visit to help as a bridge.    Surgical History:   None.  PMH/PSH/Family History/Social History/Meds/Allergies:    Past Medical History:  Diagnosis Date  . Diabetes mellitus without complication ([facility])   . Hypertension    No past surgical history on file. Social History   Socioeconomic History  . Marital status: Married    Spouse name: Not on file  . Number of children: Not on file  . Years of education: Not on file  . Highest education level: Not on file  Occupational History  . Not on file  Tobacco Use  . Smoking status: Never  . Smokeless tobacco: Never  Vaping Use  . Vaping Use: Never used  Substance and Sexual Activity  . Alcohol use: No  . Drug use: No  . Sexual activity: Not on file  Other Topics Concern  . Not on file  Social History Narrative  . Not on file   Social Determinants of Health   Financial Resource Strain: Not on file  Food Insecurity: Not on file  Transportation Needs: Not on file  Physical Activity: Not on file  Stress: Not on file  Social Connections: Not on file   No family history on file. Allergies  Allergen Reactions  . Iodine   . Shellfish Allergy Hives    Itching, swelling   Current Outpatient Medications  Medication Sig Dispense Refill  . BLACK CURRANT SEED OIL PO Take 1 capsule by mouth daily.    . Coenzyme Q10 (COQ10 PO) Take 1 tablet by mouth daily.    . Cyanocobalamin (VITAMIN B-12 PO) Take 1 tablet by mouth daily.    . cyclobenzaprine (FLEXERIL) 10 MG tablet Take 1 tablet (10 mg total) by mouth at  bedtime. (Patient not taking: Reported on 08/11/2018) 40 tablet 1  . losartan-hydrochlorothiazide (HYZAAR) 100-25 MG tablet Take 1 tablet by mouth daily.    Marland Kitchen MAGNESIUM PO Take 1 tablet by mouth daily.    . metFORMIN (GLUCOPHAGE) 500 MG tablet Take by mouth 2 (two) times daily with a meal.    . polyvinyl alcohol (LIQUIFILM TEARS) 1.4 % ophthalmic solution Place 1 drop into both eyes as needed for dry eyes.    . traMADol (ULTRAM) 50 MG tablet Take 1-2 tablets (50-100 mg total) by mouth every 6 (six) hours as needed. 40 tablet 0   No current facility-administered medications for this visit.   No results found.  Review of Systems:   A ROS was performed including pertinent positives and negatives as documented in the HPI.  Physical Exam :   Constitutional: NAD and appears stated age Neurological: Alert and oriented Psych: Appropriate affect and cooperative There were no vitals taken for this visit.   Comprehensive Musculoskeletal Exam:    Tenderness palpation about the femoral acetabular joint with limited internal rotation to 20 degrees flexion is to 90 with soreness about the  groin  Imaging:   Xray (AP pelvis right hip 2 views): Significant femoral acetabular osteoarthritis   I personally reviewed and interpreted the radiographs.   Assessment:   71 y.o. 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  Today's visit I discussed a injection with him.  He would like to proceed with a right hip injection.  This was performed under ultrasound guidance without difficulty.  Plan :    -Right hip femoral acetabular injection performed under ultrasound guidance after verbal consent obtained    Procedure Note  Patient: Frederick Harris             Date of Birth: 1950/12/11           MRN: [id number]             Visit Date: 11/26/2021  Procedures: Visit Diagnoses: No diagnosis found.  Large Joint Inj: R hip joint on 11/26/2021 4:13 PM Indications: pain Details: 22 G 3.5 in needle,  ultrasound-guided anterolateral approach  Arthrogram: No  Medications: 4 mL lidocaine 1 %; 80 mg triamcinolone acetonide 40 MG/ML Outcome: tolerated well, no immediate complications Procedure, treatment alternatives, risks and benefits explained, specific risks discussed. Consent was given by the patient. Immediately prior to procedure a time out was called to verify the correct patient, procedure, equipment, support staff and site/side marked as required. Patient was prepped and draped in the usual sterile fashion.          I personally saw and evaluated the patient, and participated in the management and treatment plan.  Huel Cote, MD Attending Physician, Orthopedic Surgery  This document was dictated using Dragon voice recognition software. A reasonable attempt at proof reading has been made to minimize errors.

## 2021-12-02 ENCOUNTER — Ambulatory Visit: Payer: Medicare Other

## 2021-12-02 DIAGNOSIS — M25652 Stiffness of left hip, not elsewhere classified: Secondary | ICD-10-CM

## 2021-12-02 DIAGNOSIS — M25551 Pain in right hip: Secondary | ICD-10-CM

## 2021-12-02 DIAGNOSIS — M5459 Other low back pain: Secondary | ICD-10-CM | POA: Diagnosis not present

## 2021-12-02 DIAGNOSIS — M6281 Muscle weakness (generalized): Secondary | ICD-10-CM

## 2021-12-02 DIAGNOSIS — M25651 Stiffness of right hip, not elsewhere classified: Secondary | ICD-10-CM

## 2021-12-02 DIAGNOSIS — R293 Abnormal posture: Secondary | ICD-10-CM

## 2021-12-02 NOTE — Therapy (Addendum)
OUTPATIENT PHYSICAL THERAPY Treatment Note  PHYSICAL THERAPY DISCHARGE SUMMARY  Visits from Start of Care: 4  Current functional level related to goals / functional outcomes: See note below   Remaining deficits: See note below   Education / Equipment: HEP  Plan: Patient is being discharged due to not returning to therapy since 12/02/2021.  He cancelled remaining appointments due to work schedule change and would require new order to return to therapy.      Rennie Natter, PT, DPT 2:23 PM 01/03/2022   Patient Name: Frederick Harris MRN: [id number] DOB:1951/03/15, 71 y.o., male Today's Date: 12/02/2021   PT End of Session - 12/02/21 0913     Visit Number 4    Number of Visits 12    Date for PT Re-Evaluation 12/29/21    Progress Note Due on Visit 10    PT Start Time 0845    PT Stop Time 0928    PT Time Calculation (min) 43 min    Activity Tolerance Patient tolerated treatment well    Behavior During Therapy [facility] for tasks assessed/performed               Past Medical History:  Diagnosis Date   Diabetes mellitus without complication ([place])    Hypertension    History reviewed. No pertinent surgical history. Patient Active Problem List   Diagnosis Date Noted   Nuclear sclerotic cataract of left eye 02/03/2021   Pterygium of left eye 01/28/2020   Macular hole of right eye 01/28/2020   Retinal detachment of right eye with single break 01/28/2020   Posterior vitreous detachment of left eye 01/28/2020   Diabetes mellitus without complication ([place]) [id number]   Unilateral primary osteoarthritis, right hip 08/21/2019   Unilateral primary osteoarthritis, left hip 03/09/2017    PCP: Penni Bombard, PA  REFERRING PROVIDER: Pete Pelt, PA-C  REFERRING DIAG: [phone] (ICD-10-CM) - Radiculopathy, lumbar region  Rationale for Evaluation and Treatment Rehabilitation  THERAPY DIAG:  Other low back pain  Stiffness of right hip, not elsewhere classified  Pain  in right hip  Stiffness of left hip, not elsewhere classified  Muscle weakness (generalized)  Abnormal posture  ONSET DATE: 5 years ago  SUBJECTIVE:                                                                                                                                                                                           SUBJECTIVE STATEMENT: Pt reports he is getting an injection for his hip on Friday.   PERTINENT HISTORY:  Bil hip OA, T2DM  PAIN:  Are you having pain?  No: NPRS scale:  0/10 Pain location: R hip Pain description: tightness Aggravating factors: uneven surfaces Relieving factors: exercises   PRECAUTIONS: None  WEIGHT BEARING RESTRICTIONS No  FALLS:  Has patient fallen in last 6 months? No and but has to pay attention picking up feet due to posture.    LIVING ENVIRONMENT: Lives with: lives with their spouse Lives in: House/apartment Stairs: Yes: External: 2 steps; none Has following equipment at home: None  OCCUPATION: part-time at [employer] in [employer].   PLOF: Independent tries to exercise legs daily, plays golf.   PATIENT GOALS improve posture and strength, decrease stiffness.    OBJECTIVE:   DIAGNOSTIC FINDINGS:  Xray lumbar spine from 2019: Lumbar spine 2 views.  No acute fractures.  Decreased disc space at L3-4  L4-5 and L5-S1 with plate spurring near fusion of the L4-5 osteophytes.    Loss of lordotic curvature.  No spondylolisthesis.  Slight scoliosis.  Xray hips from 2021 A low AP pelvis and lateral of the right hip shows both hips having significant arthritic findings with para-articular osteophytes and  significant joint space narrowing.  There is otherwise no acute findings. PATIENT SURVEYS:  Modified Oswestry 7/50 = 14% minimal disability due to Low back pain    LEFS  - 39/80 = 49% disability due to hip pain  SCREENING FOR RED FLAGS: Bowel or bladder incontinence: No Spinal tumors: No Cauda  equina syndrome: No Compression fracture: No Abdominal aneurysm: No  COGNITION:  Overall cognitive status: Within functional limits for tasks assessed     SENSATION: WFL  MUSCLE LENGTH: Hamstrings : Right 90 deg; Left 90 deg ely's test: Right 90 deg; Left 80 deg  POSTURE: flexed trunk   PALPATION: Hypomobility throughout lumbar spine, reports tightness but no pain with palpation of lumbar paraspinals, glutes and SIJ.  Tenderness R QL.   LUMBAR ROM:   Active  A/PROM  eval  Flexion To toes  Extension To neutral*  Right lateral flexion Mid thigh *  Left lateral flexion Mid thigh *  Right rotation 90% limited  Left rotation 90% limited   (Blank rows = not tested)  LOWER EXTREMITY ROM:     Passive  Right eval Left eval  Hip flexion 15 20  Hip extension    Hip abduction    Hip adduction    Hip internal rotation 15 15  Hip external rotation 16 20  Knee flexion    Knee extension    Ankle dorsiflexion (straight knee/bent knee) 5/10 7/4  Ankle plantarflexion    Ankle inversion    Ankle eversion     (Blank rows = not tested)  LOWER EXTREMITY MMT:    MMT Right eval Left eval  Hip flexion 4 in available ROM 4  Hip extension 3+ 3  Hip abduction 4 4  Hip adduction 5 5  Hip internal rotation    Hip external rotation    Knee flexion 4 2+  Knee extension 5 5  Ankle dorsiflexion 5 3+  Ankle plantarflexion    Ankle inversion    Ankle eversion     (Blank rows = not tested)  LUMBAR SPECIAL TESTS:  Straight leg raise test: Negative and FABER test: lacking hip ROM to perform  FUNCTIONAL TESTS:  5 times sit to stand: 15.5 sec  GAIT: Distance walked: 50 Assistive device utilized: None Level of assistance: Complete Independence Comments: visually slow, flexed posture, decreased heel strike, decreased hip extension, L foot slap    TODAY'S TREATMENT  12/01/21  Therapeutic Exercise: Bike L4x55min Hooklying trunk rotation 3x10" each side Supine Piriformis KTOS  stretch 2x30" each side Supine SKTC x 30 sec hold each side S/L open book 8x5" each side Prone HS curls x 10 each - less ROM on L Prone quad stretch with strap x 30 sec each Bridge w/ RTB x 10   11/24/21 Therapeutic Exercise: to improve strength and mobility.  Demo, verbal and tactile cues throughout for technique. Bike L4 x 6 min    Gastoc stretch 2x 30 sec bil Standing bil hip flexor stretch at wall dropping hips to wall 2x 20 sec LTR x 10 bil 10 sec hold Bridges x 10 Supine KTOS 2x 30 sec ea PPT x 20 Prone knee flexion R x 10, seated L knee flexion on slider x 10 Prone quad stretch with strap 2x30 sec ea  Manual: UPA mobs lumbar to decreased lumbar tightness; QL release bil in [facility]  11/19/2021 Therapeutic Exercise: to improve strength and mobility.  Demo, verbal and tactile cues throughout for technique. Bike L1 x 6 min  LTR x 10 bil  Windshield Wipers x 10 bil SKTC stretch 2 x 30 sec bil  Hip flexor stretch supine RLE x 3 min  PPT x 20  Bridges x 10  Standing hip flexor stretch x 30 sec bil  Standing hip extension with bil UE support on counter 2 x 10 bil  Gentle lumbar repeated extension on counter x 10   11/17/2021 Self Care: see patient education.   Prone x 3 min, followed by prone knee bends x 10 R.  Unable to perform on L even with tactile cues.   PATIENT EDUCATION:  Education details: DN handout 11/24/21 Person educated: Patient Education method: Explanation and Handouts Education comprehension: verbalized understanding   HOME EXERCISE PROGRAM: Access Code: [email or web address]  ASSESSMENT:  CLINICAL IMPRESSION: Pt demonstrated a good response to treatment with no increased pain. He still is tight with lumbar rotation but as the session progressed his rotation increased both ways. He demonstrates HS weakness on L with prone knee bends. Pt would continue to benefit from hip flexibility and mobility.   OBJECTIVE IMPAIRMENTS Abnormal gait, decreased activity tolerance,  decreased endurance, decreased mobility, difficulty walking, decreased ROM, decreased strength, hypomobility, increased fascial restrictions, increased muscle spasms, impaired flexibility, postural dysfunction, and pain.   ACTIVITY LIMITATIONS bending, standing, squatting, stairs, and locomotion level  PARTICIPATION LIMITATIONS: shopping, community activity, occupation, and yard work  PERSONAL FACTORS 1-2 comorbidities: bil hip OA and pain, T2DM  are also affecting patient's functional outcome.   REHAB POTENTIAL: Fair due to hip OA  CLINICAL DECISION MAKING: Evolving/moderate complexity  EVALUATION COMPLEXITY: Moderate   GOALS: Goals reviewed with patient? Yes  SHORT TERM GOALS: Target date: 12/01/2021   Patient will be independent with initial HEP.  Baseline: given Goal status: MET - 12/02/21  2.  Patient will complete LEFS or FOTO Baseline: not given Goal status: MET   LONG TERM GOALS: Target date: 12/29/2021   Patient will be independent with advanced/ongoing HEP to improve outcomes and carryover.  Baseline: needs progression Goal status: IN PROGRESS  2.  Patient will report 75% improvement in low back tightness to improve QOL.  Baseline: 6/10 tightness, but no pain Goal status: IN PROGRESS  3.  Patient to demonstrate ability to achieve and maintain good spinal alignment/posturing and body mechanics needed for daily activities. Baseline: forward flexed posture Goal status: IN PROGRESS  4.  Patient will demonstrate full pain free lumbar ROM to perform  ADLs.   Baseline: see objective, very limited Goal status: IN PROGRESS  5.  Patient will demonstrate improved LE strength as demonstrated by 4+/5 LLE strength. Baseline: see objective, weakness LLE Goal status: IN PROGRESS  6.  Patient will report MCID (10% improvement) on LEFS demonstrate improved functional ability.  Baseline: 49% disability Goal status: IN PROGRESS   7.  Patient will demonstrate upright posture  with gait. Baseline: forward flexed posture  Goal status: IN PROGRESS   PLAN: PT FREQUENCY: 2x/week  PT DURATION: 6 weeks  PLANNED INTERVENTIONS: Therapeutic exercises, Therapeutic activity, Neuromuscular re-education, Balance training, Gait training, Patient/Family education, Joint mobilization, Stair training, Dry Needling, Electrical stimulation, Spinal mobilization, Cryotherapy, Moist heat, Traction, Ultrasound, and Manual therapy.  PLAN FOR NEXT SESSION:   continue to focus on flexibility, manual therapy to R hip and lumbar to improve mobility, modalities PRN   Artist Pais, PTA 12/02/2021, 9:29 AM

## 2021-12-07 ENCOUNTER — Ambulatory Visit: Payer: Medicare Other

## 2022-02-03 ENCOUNTER — Encounter (INDEPENDENT_AMBULATORY_CARE_PROVIDER_SITE_OTHER): Payer: Medicare Other | Admitting: Ophthalmology

## 2022-02-09 ENCOUNTER — Ambulatory Visit (INDEPENDENT_AMBULATORY_CARE_PROVIDER_SITE_OTHER): Payer: Medicare Other | Admitting: Ophthalmology

## 2022-02-09 ENCOUNTER — Encounter (INDEPENDENT_AMBULATORY_CARE_PROVIDER_SITE_OTHER): Payer: Self-pay | Admitting: Ophthalmology

## 2022-02-09 DIAGNOSIS — H35341 Macular cyst, hole, or pseudohole, right eye: Secondary | ICD-10-CM

## 2022-02-09 DIAGNOSIS — H2512 Age-related nuclear cataract, left eye: Secondary | ICD-10-CM

## 2022-02-09 DIAGNOSIS — H43812 Vitreous degeneration, left eye: Secondary | ICD-10-CM | POA: Diagnosis not present

## 2022-02-09 DIAGNOSIS — H33011 Retinal detachment with single break, right eye: Secondary | ICD-10-CM

## 2022-02-09 DIAGNOSIS — H11042 Peripheral pterygium, stationary, left eye: Secondary | ICD-10-CM

## 2022-02-09 NOTE — Progress Notes (Signed)
02/09/2022     CHIEF COMPLAINT Patient presents for  Chief Complaint  Patient presents with   Macular Hole      HISTORY OF PRESENT ILLNESS: Frederick Harris is a 71 y.o. male who presents to the clinic today for:   HPI   1 YR FU OU OCT FP. Pt stated vision has been stable since last visit. Pt denies new floaters and FOL. Pt stated A1C was around 6.7 or 7.    Last edited by Silvestre Moment on 02/09/2022  9:20 AM.      Referring physician: Webb Laws, [address] [place],  [place] [address]  HISTORICAL INFORMATION:   Selected notes from the MEDICAL RECORD NUMBER       CURRENT MEDICATIONS: Current Outpatient Medications (Ophthalmic Drugs)  Medication Sig   polyvinyl alcohol (LIQUIFILM TEARS) 1.4 % ophthalmic solution Place 1 drop into both eyes as needed for dry eyes.   No current facility-administered medications for this visit. (Ophthalmic Drugs)   Current Outpatient Medications (Other)  Medication Sig   BLACK CURRANT SEED OIL PO Take 1 capsule by mouth daily.   Coenzyme Q10 (COQ10 PO) Take 1 tablet by mouth daily.   Cyanocobalamin (VITAMIN B-12 PO) Take 1 tablet by mouth daily.   cyclobenzaprine (FLEXERIL) 10 MG tablet Take 1 tablet (10 mg total) by mouth at bedtime. (Patient not taking: Reported on 08/11/2018)   losartan-hydrochlorothiazide (HYZAAR) 100-25 MG tablet Take 1 tablet by mouth daily.   MAGNESIUM PO Take 1 tablet by mouth daily.   metFORMIN (GLUCOPHAGE) 500 MG tablet Take by mouth 2 (two) times daily with a meal.   traMADol (ULTRAM) 50 MG tablet Take 1-2 tablets (50-100 mg total) by mouth every 6 (six) hours as needed.   No current facility-administered medications for this visit. (Other)      REVIEW OF SYSTEMS: ROS   Negative for: Constitutional, Gastrointestinal, Neurological, Skin, Genitourinary, Musculoskeletal, HENT, Endocrine, Cardiovascular, Eyes, Respiratory, Psychiatric, Allergic/Imm, Heme/Lymph Last edited by Silvestre Moment on  02/09/2022  9:19 AM.       ALLERGIES Allergies  Allergen Reactions   Iodine    Shellfish Allergy Hives    Itching, swelling    PAST MEDICAL HISTORY Past Medical History:  Diagnosis Date   Diabetes mellitus without complication ([place])    Hypertension    History reviewed. No pertinent surgical history.  FAMILY HISTORY History reviewed. No pertinent family history.  SOCIAL HISTORY Social History   Tobacco Use   Smoking status: Never   Smokeless tobacco: Never  Vaping Use   Vaping Use: Never used  Substance Use Topics   Alcohol use: No   Drug use: No         OPHTHALMIC EXAM:  Base Eye Exam     Visual Acuity (ETDRS)       Right Left   Dist [place] 20/150 -1 20/20 -2   Dist ph [place] 20/100 -2          Tonometry (Tonopen, 9:24 AM)       Right Left   Pressure 14 15         Pupils       Pupils APD   Right PERRL None   Left PERRL None         Visual Fields       Left Right    Full    Restrictions  Partial outer superior temporal deficiency         Extraocular Movement  Right Left    Full, Ortho Full, Ortho         Neuro/Psych     Oriented x3: Yes   Mood/Affect: Normal         Dilation     Both eyes: 1.0% Mydriacyl, 2.5% Phenylephrine @ 9:24 AM           Slit Lamp and Fundus Exam     External Exam       Right Left   External Normal Normal         Slit Lamp Exam       Right Left   Lids/Lashes Normal Normal   Conjunctiva/Sclera White and quiet White and quiet   Cornea Clear Clear   Anterior Chamber Deep and quiet Deep and quiet   Iris Round and reactive Round and reactive   Lens Centered posterior chamber intraocular lens 2+ Nuclear sclerosis   Anterior Vitreous Normal Normal         Fundus Exam       Right Left   Posterior Vitreous Clear, vitrectomized Normal   Disc Normal Normal   C/D Ratio .6 .6   Macula Chronic macular hole, 800 m, stable Normal   Vessels Normal NO DR   Periphery Good buckle  360, No holes or tears No holes or tears            IMAGING AND PROCEDURES  Imaging and Procedures for 02/09/22  OCT, Retina - OU - Both Eyes       Right Eye Quality was good. Scan locations included subfoveal. Central Foveal Thickness: 268. Progression has been stable. Findings include macular hole.   Left Eye Quality was good. Scan locations included subfoveal. Central Foveal Thickness: 263. Progression has been stable. Findings include normal foveal contour.   Notes OS with PVD  OD with chronic macular hole, atrophic, not resectable      Color Fundus Photography Optos - OU - Both Eyes       Right Eye Progression has been stable. Disc findings include pallor. Macula : normal observations, macular hole. Vessels : normal observations.   Left Eye Progression has been stable. Disc findings include normal observations. Macula : normal observations. Vessels : normal observations. Periphery : normal observations.   Notes Macular hole OD stable, good scleral buckle, retina attached good chorioretinal scars  OS clear media and normal periphery              ASSESSMENT/PLAN:  Retinal detachment of right eye with single break Repaired distant past.  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 Macular hole of right eye  H35.341 OCT, Retina - OU - Both Eyes    Color Fundus Photography Optos - OU - Both Eyes    2. Retinal detachment of right eye with single break  H33.011     3. Posterior vitreous detachment of left eye  H43.812     4. Nuclear sclerotic cataract of left eye  H25.12     5. Peripheral pterygium, stationary, left  H11.042        1.  OD, status post macula off, macular hole retinal detachment.  Repaired years past doing well.  2.  OS with  moderate cataract no impact on acuity.  Incidental PVD also present OS.  3.  OS with nonvisually distorting temporal located pterygium stable overall  4.  Follow-up Dr. Hubbard Robinson as scheduled  Ophthalmic Meds Ordered this visit:  No orders of the defined types were placed in this encounter.      Return in about 1 year (around 02/10/2023) for DILATE OU, COLOR FP, OCT.  There are no Patient Instructions on file for this visit.   Explained the diagnoses, plan, and follow up with the patient and they expressed understanding.  Patient expressed understanding of the importance of proper follow up care.   Alford Highland Sujay Grundman M.D. Diseases & Surgery of the Retina and Vitreous [facility] 02/09/22     Abbreviations: M myopia (nearsighted); A astigmatism; H hyperopia (farsighted); P presbyopia; Mrx spectacle prescription;  CTL contact lenses; OD right eye; OS left eye; OU both eyes  XT exotropia; ET esotropia; PEK punctate epithelial keratitis; PEE punctate epithelial erosions; DES dry eye syndrome; MGD meibomian gland dysfunction; ATs artificial tears; PFAT's preservative free artificial tears; NSC nuclear sclerotic cataract; PSC posterior subcapsular cataract; ERM epi-retinal membrane; PVD posterior vitreous detachment; RD retinal detachment; DM diabetes mellitus; DR diabetic retinopathy; NPDR non-proliferative diabetic retinopathy; PDR proliferative diabetic retinopathy; CSME clinically significant macular edema; DME diabetic macular edema; dbh dot blot hemorrhages; CWS cotton wool spot; POAG primary open angle glaucoma; C/D cup-to-disc ratio; HVF humphrey visual field; GVF goldmann visual field; OCT optical coherence tomography; IOP intraocular pressure; BRVO Branch retinal vein occlusion; CRVO central retinal vein occlusion; CRAO central retinal artery occlusion;  BRAO branch retinal artery occlusion; RT retinal tear; SB scleral buckle; PPV pars plana vitrectomy; VH Vitreous hemorrhage; PRP panretinal laser photocoagulation; IVK intravitreal kenalog; VMT vitreomacular traction; MH Macular hole;  NVD neovascularization of the disc; NVE neovascularization elsewhere; AREDS age related eye disease study; ARMD age related macular degeneration; POAG primary open angle glaucoma; EBMD epithelial/anterior basement membrane dystrophy; ACIOL anterior chamber intraocular lens; IOL intraocular lens; PCIOL posterior chamber intraocular lens; Phaco/IOL phacoemulsification with intraocular lens placement; PRK photorefractive keratectomy; LASIK laser assisted in situ keratomileusis; HTN hypertension; DM diabetes mellitus; COPD chronic obstructive pulmonary disease

## 2022-02-09 NOTE — Assessment & Plan Note (Signed)
Physiologic OS 

## 2022-02-09 NOTE — Assessment & Plan Note (Signed)
On temporal aspect of cornea, no progression centrally

## 2022-02-09 NOTE — Assessment & Plan Note (Signed)
Repaired distant past.  Triggered by macular hole,  post surgery with chronic macular hole formation

## 2022-02-09 NOTE — Assessment & Plan Note (Signed)
Moderate OS

## 2022-02-09 NOTE — Assessment & Plan Note (Signed)
Chronic atrophic nonresectable

## 2022-07-14 ENCOUNTER — Encounter: Payer: Self-pay | Admitting: Radiology

## 2023-02-14 ENCOUNTER — Encounter (INDEPENDENT_AMBULATORY_CARE_PROVIDER_SITE_OTHER): Payer: Self-pay

## 2023-02-14 ENCOUNTER — Encounter (INDEPENDENT_AMBULATORY_CARE_PROVIDER_SITE_OTHER): Payer: No Typology Code available for payment source | Admitting: Ophthalmology

## 2023-06-12 ENCOUNTER — Ambulatory Visit (INDEPENDENT_AMBULATORY_CARE_PROVIDER_SITE_OTHER): Payer: Medicare Other | Admitting: Physician Assistant

## 2023-06-12 ENCOUNTER — Other Ambulatory Visit (INDEPENDENT_AMBULATORY_CARE_PROVIDER_SITE_OTHER): Payer: Self-pay

## 2023-06-12 DIAGNOSIS — M25551 Pain in right hip: Secondary | ICD-10-CM

## 2023-06-12 NOTE — Progress Notes (Signed)
Office Visit Note   Patient: Frederick Harris           Date of Birth: 17-Nov-1950           MRN: [id number] Visit Date: 06/12/2023              Requested by: [facility], [facility] [address] [place],  [place] [address] PCP: [facility]   Assessment & Plan: Visit Diagnoses:  1. Pain in right hip     Plan: At this point time due to the fact that he has failed conservative treatment he would like to proceed with a right total hip arthroplasty in the near future.  He will need to be seen by his primary care physician to get clearance for surgery.  Did discuss with him that his glucose levels would need to be under good control.  Risk benefits of surgery discussed.  Risks and benefits and surgical procedure and postoperative protocol been discussed with patient prior with Dr. Magnus Ivan.  Risks were reviewed and include but are not limited to DVT/PE, nerve vessel injury, wound healing problems, leg length discrepancy and prolonged pain.  Follow-Up Instructions: Return for post op.   Orders:  Orders Placed This Encounter  Procedures   XR HIP UNILAT W OR W/O PELVIS 2-3 VIEWS RIGHT   No orders of the defined types were placed in this encounter.     Procedures: No procedures performed   Clinical Data: No additional findings.   Subjective: Chief Complaint  Patient presents with   Right Hip - Pain    HPI HPI: Frederick Harris 73 year old male with right hip pain.  He has known osteoarthritis of his right hip.  However his pain overall is becoming worse.  Ranks pain to be 8 out of 10 pain.  He has tried cortisone injections in the hip in the past which helped some.  He has trouble now donning shoes and socks.  He uses a cane to ambulate.  Has had no new injury to the hip.  Has tried tramadol which has been helpful.  He is diabetic he is no longer taking his metformin.  States his glucose levels been under control however he has not seen his primary care physician in over a  year.  Review of Systems Denies any fevers, chills, ongoing infections, chest pain shortness of breath.  Objective: Vital Signs: There were no vitals taken for this visit.  Physical Exam Constitutional:      Appearance: He is not ill-appearing or diaphoretic.  Pulmonary:     Effort: Pulmonary effort is normal.  Neurological:     Mental Status: He is alert and oriented to person, place, and time.  Psychiatric:        Mood and Affect: Mood normal.     Ortho Exam Bilateral hips: Decreased range of motion left hip with external and internal rotation but no blocks.  Right hip virtually no internal or external rotation.  Calfs are supple nontender bilaterally.  Dorsiflexion plantarflexion bilateral ankles intact.  Leg length discrepancy with the right leg being slightly shorter than the left. Specialty Comments:  No specialty comments available.  Imaging: XR HIP UNILAT W OR W/O PELVIS 2-3 VIEWS RIGHT Result Date: 06/12/2023 AP pelvis lateral view right hip: Severe end-stage arthritis right hip with periarticular spurring.  Moderate to moderately severe arthritic changes left hip.  Bilateral hips well located.  No acute fractures.  No evidence of AVN.    PMFS History: Patient Active Problem List  Diagnosis Date Noted   Nuclear sclerotic cataract of left eye 02/03/2021   Peripheral pterygium, stationary, left 01/28/2020   Macular hole of right eye 01/28/2020   Retinal detachment of right eye with single break 01/28/2020   Posterior vitreous detachment of left eye 01/28/2020   Diabetes mellitus without complication ([facility]) 01/28/2020   Unilateral primary osteoarthritis, right hip 08/21/2019   Unilateral primary osteoarthritis, left hip 03/09/2017   Past Medical History:  Diagnosis Date   Diabetes mellitus without complication ([facility])    Hypertension     No family history on file.  No past surgical history on file. Social History   Occupational History   Not on file  Tobacco  Use   Smoking status: Never   Smokeless tobacco: Never  Vaping Use   Vaping status: Never Used  Substance and Sexual Activity   Alcohol use: No   Drug use: No   Sexual activity: Not on file

## 2023-07-04 ENCOUNTER — Other Ambulatory Visit: Payer: Self-pay

## 2023-07-04 ENCOUNTER — Encounter (HOSPITAL_BASED_OUTPATIENT_CLINIC_OR_DEPARTMENT_OTHER): Payer: Self-pay | Admitting: Emergency Medicine

## 2023-07-04 ENCOUNTER — Emergency Department (HOSPITAL_BASED_OUTPATIENT_CLINIC_OR_DEPARTMENT_OTHER): Payer: Medicare Other

## 2023-07-04 DIAGNOSIS — I10 Essential (primary) hypertension: Secondary | ICD-10-CM | POA: Diagnosis not present

## 2023-07-04 DIAGNOSIS — Z7984 Long term (current) use of oral hypoglycemic drugs: Secondary | ICD-10-CM | POA: Insufficient documentation

## 2023-07-04 DIAGNOSIS — Z79899 Other long term (current) drug therapy: Secondary | ICD-10-CM | POA: Insufficient documentation

## 2023-07-04 DIAGNOSIS — I2699 Other pulmonary embolism without acute cor pulmonale: Principal | ICD-10-CM | POA: Insufficient documentation

## 2023-07-04 DIAGNOSIS — E119 Type 2 diabetes mellitus without complications: Secondary | ICD-10-CM | POA: Insufficient documentation

## 2023-07-04 DIAGNOSIS — M545 Low back pain, unspecified: Secondary | ICD-10-CM | POA: Diagnosis present

## 2023-07-04 NOTE — ED Triage Notes (Addendum)
 Pt reports mid back pain that worsens with inhalation, denies any known injury, has hx of back strain and states this feels the same; denies cough, ShoB, CP, dysuria or other s/s, denies hx of kidney stones

## 2023-07-04 NOTE — ED Notes (Signed)
 Pt has UA sample jar

## 2023-07-05 ENCOUNTER — Observation Stay (HOSPITAL_BASED_OUTPATIENT_CLINIC_OR_DEPARTMENT_OTHER)
Admission: EM | Admit: 2023-07-05 | Discharge: 2023-07-07 | Disposition: A | Discharge status: Home / Self Care | Payer: Medicare Other | Attending: Internal Medicine | Admitting: Internal Medicine

## 2023-07-05 ENCOUNTER — Inpatient Hospital Stay (HOSPITAL_COMMUNITY): Payer: Medicare Other

## 2023-07-05 ENCOUNTER — Emergency Department (HOSPITAL_BASED_OUTPATIENT_CLINIC_OR_DEPARTMENT_OTHER): Payer: Medicare Other

## 2023-07-05 DIAGNOSIS — I2699 Other pulmonary embolism without acute cor pulmonale: Secondary | ICD-10-CM | POA: Diagnosis not present

## 2023-07-05 DIAGNOSIS — I2693 Single subsegmental pulmonary embolism without acute cor pulmonale: Secondary | ICD-10-CM

## 2023-07-05 DIAGNOSIS — M545 Low back pain, unspecified: Secondary | ICD-10-CM | POA: Diagnosis present

## 2023-07-05 DIAGNOSIS — Z7984 Long term (current) use of oral hypoglycemic drugs: Secondary | ICD-10-CM | POA: Diagnosis not present

## 2023-07-05 DIAGNOSIS — I10 Essential (primary) hypertension: Secondary | ICD-10-CM | POA: Diagnosis not present

## 2023-07-05 DIAGNOSIS — E119 Type 2 diabetes mellitus without complications: Secondary | ICD-10-CM | POA: Diagnosis not present

## 2023-07-05 DIAGNOSIS — Z79899 Other long term (current) drug therapy: Secondary | ICD-10-CM | POA: Diagnosis not present

## 2023-07-05 HISTORY — DX: Other pulmonary embolism without acute cor pulmonale: I26.99

## 2023-07-05 LAB — ECHOCARDIOGRAM COMPLETE
AR max vel: 2.79 cm2
AV Area VTI: 2.59 cm2
AV Area mean vel: 2.71 cm2
AV Mean grad: 4 mmHg
AV Peak grad: 8.6 mmHg
Ao pk vel: 1.47 m/s
Area-P 1/2: 4.17 cm2
Calc EF: 46.8 %
P 1/2 time: 481 ms
S' Lateral: 4.4 cm
Single Plane A2C EF: 50.2 %
Single Plane A4C EF: 44.9 %

## 2023-07-05 LAB — GLUCOSE, CAPILLARY
Glucose-Capillary: 141 mg/dL — ABNORMAL HIGH (ref 70–99)
Glucose-Capillary: 116 mg/dL — ABNORMAL HIGH (ref 70–99)

## 2023-07-05 LAB — CBC WITH DIFFERENTIAL/PLATELET
Abs Immature Granulocytes: 0.02 10*3/uL (ref 0.00–0.07)
Basophils Absolute: 0 10*3/uL (ref 0.0–0.1)
Basophils Relative: 1 %
Eosinophils Absolute: 0.1 10*3/uL (ref 0.0–0.5)
Eosinophils Relative: 1 %
HCT: 39.3 % (ref 39.0–52.0)
Hemoglobin: 13 g/dL (ref 13.0–17.0)
Immature Granulocytes: 0 %
Lymphocytes Relative: 20 %
Lymphs Abs: 1.5 10*3/uL (ref 0.7–4.0)
MCH: 28 pg (ref 26.0–34.0)
MCHC: 33.1 g/dL (ref 30.0–36.0)
MCV: 84.7 fL (ref 80.0–100.0)
Monocytes Absolute: 0.8 10*3/uL (ref 0.1–1.0)
Monocytes Relative: 11 %
Neutro Abs: 5.2 10*3/uL (ref 1.7–7.7)
Neutrophils Relative %: 67 %
Platelets: 240 10*3/uL (ref 150–400)
RBC: 4.64 MIL/uL (ref 4.22–5.81)
RDW: 13.5 % (ref 11.5–15.5)
WBC: 7.7 10*3/uL (ref 4.0–10.5)
nRBC: 0 % (ref 0.0–0.2)

## 2023-07-05 LAB — BASIC METABOLIC PANEL
Anion gap: 11 (ref 5–15)
Anion gap: 12 (ref 5–15)
BUN: 12 mg/dL (ref 8–23)
BUN: 15 mg/dL (ref 8–23)
CO2: 23 mmol/L (ref 22–32)
CO2: 24 mmol/L (ref 22–32)
Calcium: 9 mg/dL (ref 8.9–10.3)
Calcium: 9.1 mg/dL (ref 8.9–10.3)
Chloride: 97 mmol/L — ABNORMAL LOW (ref 98–111)
Chloride: 99 mmol/L (ref 98–111)
Creatinine, Ser: 0.78 mg/dL (ref 0.61–1.24)
Creatinine, Ser: 0.86 mg/dL (ref 0.61–1.24)
GFR, Estimated: >60 mL/min (ref >60)
GFR, Estimated: >60 mL/min (ref >60)
Glucose, Bld: 126 mg/dL — ABNORMAL HIGH (ref 70–99)
Glucose, Bld: 147 mg/dL — ABNORMAL HIGH (ref 70–99)
Potassium: 3.6 mmol/L (ref 3.5–5.1)
Potassium: 3.7 mmol/L (ref 3.5–5.1)
Sodium: 131 mmol/L — ABNORMAL LOW (ref 135–145)
Sodium: 135 mmol/L (ref 135–145)

## 2023-07-05 LAB — CBC
HCT: 34.9 % — ABNORMAL LOW (ref 39.0–52.0)
Hemoglobin: 11.6 g/dL — ABNORMAL LOW (ref 13.0–17.0)
MCH: 27.9 pg (ref 26.0–34.0)
MCHC: 33.2 g/dL (ref 30.0–36.0)
MCV: 83.9 fL (ref 80.0–100.0)
Platelets: 229 10*3/uL (ref 150–400)
RBC: 4.16 MIL/uL — ABNORMAL LOW (ref 4.22–5.81)
RDW: 13.4 % (ref 11.5–15.5)
WBC: 7.1 10*3/uL (ref 4.0–10.5)
nRBC: 0 % (ref 0.0–0.2)

## 2023-07-05 LAB — TROPONIN I (HIGH SENSITIVITY)
Troponin I (High Sensitivity): 12 ng/L (ref <18)
Troponin I (High Sensitivity): 14 ng/L (ref <18)

## 2023-07-05 LAB — D-DIMER, QUANTITATIVE: D-Dimer, Quant: 2.71 ug{FEU}/mL — ABNORMAL HIGH (ref 0.00–0.50)

## 2023-07-05 LAB — HEPARIN LEVEL (UNFRACTIONATED)
Heparin Unfractionated: 0.28 [IU]/mL — ABNORMAL LOW (ref 0.30–0.70)
Heparin Unfractionated: 0.47 [IU]/mL (ref 0.30–0.70)

## 2023-07-05 MED ORDER — ACETAMINOPHEN 325 MG PO TABS: 650.0000 mg | ORAL_TABLET | Freq: Four times a day (QID) | ORAL | Status: DC | PRN

## 2023-07-05 MED ORDER — POLYETHYLENE GLYCOL 3350 17 G PO PACK: 17.0000 g | PACK | Freq: Every day | ORAL | Status: DC | PRN

## 2023-07-05 MED ORDER — HYDROCHLOROTHIAZIDE 25 MG PO TABS
25.0000 mg | ORAL_TABLET | Freq: Every day | ORAL | Status: DC
  Administered 2023-07-05 – 2023-07-07 (×3): 25 mg via ORAL
  Filled 2023-07-05 (×3): qty 1

## 2023-07-05 MED ORDER — ACETAMINOPHEN 650 MG RE SUPP: 650.0000 mg | Freq: Four times a day (QID) | RECTAL | Status: DC | PRN

## 2023-07-05 MED ORDER — LOSARTAN POTASSIUM-HCTZ 100-25 MG PO TABS: 1.0000 | ORAL_TABLET | Freq: Every day | ORAL | Status: DC

## 2023-07-05 MED ORDER — LOSARTAN POTASSIUM 50 MG PO TABS
100.0000 mg | ORAL_TABLET | Freq: Every day | ORAL | Status: DC
  Administered 2023-07-05 – 2023-07-07 (×3): 100 mg via ORAL
  Filled 2023-07-05: qty 4
  Filled 2023-07-05 (×2): qty 2

## 2023-07-05 MED ORDER — IOHEXOL 350 MG/ML SOLN
75.0000 mL | Freq: Once | INTRAVENOUS | Status: AC | PRN
  Administered 2023-07-05: 75 mL via INTRAVENOUS

## 2023-07-05 MED ORDER — METFORMIN HCL ER 500 MG PO TB24: 500.0000 mg | ORAL_TABLET | Freq: Every day | ORAL | Status: DC

## 2023-07-05 MED ORDER — METFORMIN HCL ER 500 MG PO TB24
500.0000 mg | ORAL_TABLET | Freq: Every day | ORAL | Status: DC
  Filled 2023-07-05: qty 1

## 2023-07-05 MED ORDER — SODIUM CHLORIDE 0.9% FLUSH
3.0000 mL | Freq: Two times a day (BID) | INTRAVENOUS | Status: DC
  Administered 2023-07-05 – 2023-07-07 (×5): 3 mL via INTRAVENOUS

## 2023-07-05 MED ORDER — HEPARIN (PORCINE) 25000 UT/250ML-% IV SOLN
1600.0000 [IU]/h | INTRAVENOUS | Status: AC
  Administered 2023-07-05: 1400 [IU]/h via INTRAVENOUS
  Administered 2023-07-05: 1600 [IU]/h via INTRAVENOUS
  Filled 2023-07-05 (×2): qty 250

## 2023-07-05 MED ORDER — POLYVINYL ALCOHOL 1.4 % OP SOLN: 1.0000 [drp] | OPHTHALMIC | Status: DC | PRN

## 2023-07-05 MED ORDER — PERFLUTREN LIPID MICROSPHERE
1.0000 mL | INTRAVENOUS | Status: AC | PRN
  Administered 2023-07-05: 3 mL via INTRAVENOUS

## 2023-07-05 MED ORDER — HEPARIN BOLUS VIA INFUSION
6000.0000 [IU] | Freq: Once | INTRAVENOUS | Status: AC
Start: 2023-07-05 — End: 2023-07-05
  Administered 2023-07-05: 6000 [IU] via INTRAVENOUS

## 2023-07-05 MED ORDER — OXYMETAZOLINE HCL 0.05 % NA SOLN: 1.0000 | Freq: Two times a day (BID) | NASAL | Status: DC | PRN

## 2023-07-05 NOTE — Progress Notes (Signed)
 PHARMACY - ANTICOAGULATION CONSULT NOTE  Pharmacy Consult for IV heparin Indication: pulmonary embolus  Allergies  Allergen Reactions   Iodine Other (See Comments)   Shellfish Allergy Hives, Itching and Swelling    Patient Measurements: Height: 5\' 10"  (177.8 cm) Weight: 95.3 kg (210 lb) IBW/kg (Calculated) : 73 Heparin Dosing Weight: 92.5 kg  Vital Signs: Temp: 97.7 F (36.5 C) (02/26 1950) Temp Source: Oral (02/26 1950) BP: 135/58 (02/26 1950) Pulse Rate: 68 (02/26 1950)  Labs: Recent Labs    07/04/23 2343 07/05/23 0407 07/05/23 1314 07/05/23 1440 07/05/23 2202  HGB 13.0  --   --  11.6*  --   HCT 39.3  --   --  34.9*  --   PLT 240  --   --  229  --   HEPARINUNFRC  --   --  0.28*  --  0.47  CREATININE 0.78  --   --  0.86  --   TROPONINIHS 12 14  --   --   --     Estimated Creatinine Clearance: 91.3 mL/min (by C-G formula based on SCr of 0.86 mg/dL).   Medical History: Past Medical History:  Diagnosis Date   Diabetes mellitus without complication (HCC)    Hypertension     Assessment: Frederick Harris is a 73 y.o. year old male admitted on 07/05/2023 with concern for acute PE. CTA with small nonocclusive pulmonary embolus (no R heart strain noted). No anticoagulation prior to admission. Pharmacy consulted to dose heparin.  HL 0.47 - therapeutic on 1600 units/hr  Goal of Therapy:  Heparin level 0.3-0.7 units/ml Monitor platelets by anticoagulation protocol: Yes   Plan:  Continue heparin infusion 1600 units/hr Daily heparin level, CBC, and monitoring for bleeding F/u plans for anticoagulation longterm  Thank you for allowing pharmacy to participate in this patient's care.  Calton Dach, PharmD, BCCCP Clinical Pharmacist 07/05/2023 10:37 PM

## 2023-07-05 NOTE — ED Notes (Signed)
 Called lab to get courier for Heparin due at 1200.   Robina Ade, RN

## 2023-07-05 NOTE — Progress Notes (Signed)
   07/05/23 1420  TOC Brief Assessment  Insurance and Status Reviewed (Medicare A and B)  Patient has primary care physician Yes Yetta Barre, Modena Slater, MD)  Home environment has been reviewed From home with Spouse  Prior level of function: Independent  Prior/Current Home Services No current home services  Social Drivers of Health Review SDOH reviewed no interventions necessary  Readmission risk has been reviewed Yes (8%)  Transition of care needs no transition of care needs at this time    No TOC needs identified- Please place consult should needs arise

## 2023-07-05 NOTE — H&P (Signed)
 History and Physical   Frederick Harris NGE:[id number] DOB: [birth year] DOA: 07/05/2023  PCP: Knox Royalty, MD   Patient coming from: Home  Chief Complaint: Back pain  HPI: Frederick Harris is a 73 y.o. male with medical history significant of hypertension, diabetes presenting with back pain.  Patient presented with 1 day of right-sided mid back pain.  Noted to have episodes of pleuritic pain as well.  No significant pain on presentation to the ED.  Denies fever, chills, chest pain, shortness of breath, abdominal pain, constipation, diarrhea, nausea, vomiting.  ED Course: Vital signs in the ED notable for blood pressure in the 130s to 170s systolic, heart rate in the 60s to 100s.  Saturating well on room air.  Lab workup included BMP with sodium 131, chloride 97, glucose 147.  CBC within normal limits.  Troponin negative x 2.  D-dimer elevated 2.21.  Urinalysis pending.  Chest x-ray send acute normality.  CTA PE study was positive for small nonocclusive PE at the right lower lobe.  Trace pleural effusion also noted.  Patient started on heparin infusion in the ED.  Also received his home blood pressure medications.  Review of Systems: As per HPI otherwise all other systems reviewed and are negative.  Past Medical History:  Diagnosis Date   Diabetes mellitus without complication (HCC)    Hypertension     History reviewed. No pertinent surgical history.  Social History  reports that he has never smoked. He has never used smokeless tobacco. He reports that he does not drink alcohol and does not use drugs.  Allergies  Allergen Reactions   Iodine Other (See Comments)   Shellfish Allergy Hives, Itching and Swelling   History reviewed. No pertinent family history.  Prior to Admission medications   Medication Sig Start Date End Date Taking? Authorizing Provider  BLACK CURRANT SEED OIL PO Take 1 capsule by mouth daily.   Yes [provider]  Cholecalciferol (VITAMIN D3) LIQD Take 5  drops by mouth daily.   Yes [provider]  Coenzyme Q10 (COQ10 PO) Take 1 tablet by mouth daily.   Yes [provider]  Cyanocobalamin (VITAMIN B-12 PO) Take 1 tablet by mouth daily.   Yes [provider]  losartan-hydrochlorothiazide (HYZAAR) 100-25 MG tablet Take 1 tablet by mouth at bedtime. 07/30/18  Yes [provider]  MAGNESIUM PO Take 1 tablet by mouth daily.   Yes [provider]  metFORMIN (GLUCOPHAGE-XR) 500 MG 24 hr tablet Take 500 mg by mouth daily. 06/28/23  Yes [provider]  polyvinyl alcohol (LIQUIFILM TEARS) 1.4 % ophthalmic solution Place 1 drop into both eyes as needed for dry eyes.   Yes [provider]    Physical Exam: Vitals:   07/05/23 1130 07/05/23 1200 07/05/23 1215 07/05/23 1326  BP: 134/64 136/62  (!) 160/69  Pulse: 69 68  80  Resp: 16 14  18   Temp:   98.3 F (36.8 C) 98.4 F (36.9 C)  TempSrc:   Oral Oral  SpO2: 100% 94%  97%  Weight:      Height:        Physical Exam Constitutional:      General: He is not in acute distress.    Appearance: Normal appearance.  HENT:     Head: Normocephalic and atraumatic.     Mouth/Throat:     Mouth: Mucous membranes are moist.     Pharynx: Oropharynx is clear.  Eyes:     Extraocular Movements: Extraocular  movements intact.     Pupils: Pupils are equal, round, and reactive to light.  Cardiovascular:     Rate and Rhythm: Normal rate and regular rhythm.     Pulses: Normal pulses.     Heart sounds: Normal heart sounds.  Pulmonary:     Effort: Pulmonary effort is normal. No respiratory distress.     Breath sounds: Normal breath sounds.  Abdominal:     General: Bowel sounds are normal. There is no distension.     Palpations: Abdomen is soft.     Tenderness: There is no abdominal tenderness.  Musculoskeletal:        General: No swelling or deformity.  Skin:    General: Skin is warm and dry.  Neurological:     General: No focal deficit present.      Mental Status: Mental status is at baseline.    Labs on Admission: I have personally reviewed following labs and imaging studies  CBC: Recent Labs  Lab 07/04/23 2343  WBC 7.7  NEUTROABS 5.2  HGB 13.0  HCT 39.3  MCV 84.7  PLT 240    Basic Metabolic Panel: Recent Labs  Lab 07/04/23 2343  NA 131*  K 3.7  CL 97*  CO2 23  GLUCOSE 147*  BUN 15  CREATININE 0.78  CALCIUM 9.1    GFR: Estimated Creatinine Clearance: 98.1 mL/min (by C-G formula based on SCr of 0.78 mg/dL).  Liver Function Tests: No results for input(s): "AST", "ALT", "ALKPHOS", "BILITOT", "PROT", "ALBUMIN" in the last 168 hours.  Urine analysis: No results found for: "COLORURINE", "APPEARANCEUR", "LABSPEC", "PHURINE", "GLUCOSEU", "HGBUR", "BILIRUBINUR", "KETONESUR", "PROTEINUR", "UROBILINOGEN", "NITRITE", "LEUKOCYTESUR"  Radiological Exams on Admission: CT Angio Chest PE W and/or Wo Contrast Result Date: 07/05/2023 CLINICAL DATA:  Shortness of breath EXAM: CT ANGIOGRAPHY CHEST WITH CONTRAST TECHNIQUE: Multidetector CT imaging of the chest was performed using the standard protocol during bolus administration of intravenous contrast. Multiplanar CT image reconstructions and MIPs were obtained to evaluate the vascular anatomy. RADIATION DOSE REDUCTION: This exam was performed according to the departmental dose-optimization program which includes automated exposure control, adjustment of the mA and/or kV according to patient size and/or use of iterative reconstruction technique. CONTRAST:  75mL OMNIPAQUE IOHEXOL 350 MG/ML SOLN COMPARISON:  None Available. FINDINGS: Cardiovascular: Heart is normal size. Aorta is normal caliber. Partially occlusive small pulmonary embolus noted in the right lower lobe. No pulmonary emboli on the left. Scattered aortic calcifications. Mediastinum/Nodes: No mediastinal, hilar, or axillary adenopathy. Trachea and esophagus are unremarkable. Thyroid unremarkable. Lungs/Pleura: Trace right  pleural effusion. Right base atelectasis. Left lung clear. Upper Abdomen: No acute findings Musculoskeletal: Mild bilateral gynecomastia. No acute bony abnormality. Review of the MIP images confirms the above findings. IMPRESSION: Small filling defect in the right lower lobe compatible with small nonocclusive pulmonary embolus. Trace right pleural effusion, right base atelectasis. Aortic Atherosclerosis (ICD10-I70.0). Electronically Signed   By: Charlett Nose M.D.   On: 07/05/2023 03:50   DG Chest 2 View Result Date: 07/05/2023 CLINICAL DATA:  Right lower back pain EXAM: CHEST - 2 VIEW COMPARISON:  None Available. FINDINGS: The heart size and mediastinal contours are within normal limits. Both lungs are clear. The visualized skeletal structures are unremarkable. IMPRESSION: No active cardiopulmonary disease. Electronically Signed   By: Charlett Nose M.D.   On: 07/05/2023 00:25   EKG: Independently reviewed.  Sinus rhythm at 90 bpm.  PVCs noted.  T wave inversions in 3, V5, V6.  Evidence of LVH.  Assessment/Plan Principal Problem:  Acute pulmonary embolism (HCC) Active Problems:   Diabetes mellitus without complication (HCC)   Acute PE > Patient presented primarily with back pain and pleuritic pain.  Saturating well on room air.  Small right lower lobe PE noted on CTA. > Started on heparin in the ED. - Echocardiogram - LE dopplers - Transition to oral anticoagulant - Supportive care  Hypertension - Continue home losartan hydrochlorothiazide  Diabetes - Continue Metformin  DVT prophylaxis: Heparin, likely oral anticoagulant Code Status:   Full Family Communication:  Updated at bedside  Disposition Plan:   Patient is from:  Home  Anticipated DC to:  Home  Anticipated DC date:  1 to 2 days  Anticipated DC barriers: None  Consults called:  None Admission status:  Inpatient(already accepted as inpatient overnight), telemetry  Severity of Illness: The appropriate patient status for  this patient is INPATIENT. Inpatient status is judged to be reasonable and necessary in order to provide the required intensity of service to ensure the patient's safety. The patient's presenting symptoms, physical exam findings, and initial radiographic and laboratory data in the context of their chronic comorbidities is felt to place them at high risk for further clinical deterioration. Furthermore, it is not anticipated that the patient will be medically stable for discharge from the hospital within 2 midnights of admission.   * I certify that at the point of admission it is my clinical judgment that the patient will require inpatient hospital care spanning beyond 2 midnights from the point of admission due to high intensity of service, high risk for further deterioration and high frequency of surveillance required.Synetta Fail MD [facility]  How to contact the [facility] Attending or Consulting provider 7A - 7P or covering provider during after hours 7P -7A, for this patient?   Check the care team in [facility] and look for a) attending/consulting [facility] provider listed and b) the [facility] team listed Log into [email or web address] and use [place]'s universal password to access. If you do not have the password, please contact the hospital operator. Locate the [facility] provider you are looking for under [facility] and page to a number that you can be directly reached. If you still have difficulty reaching the provider, please page the [facility] (Director on Call) for the Hospitalists listed on amion for assistance.  07/05/2023, 2:30 PM

## 2023-07-05 NOTE — Progress Notes (Signed)
 PHARMACY - ANTICOAGULATION CONSULT NOTE  Pharmacy Consult for IV heparin Indication: pulmonary embolus  Allergies  Allergen Reactions   Iodine    Shellfish Allergy Hives    Itching, swelling    Patient Measurements: Height: 5\' 10"  (177.8 cm) Weight: 95.3 kg (210 lb) IBW/kg (Calculated) : 73 Heparin Dosing Weight: 92.5 kg  Vital Signs: Temp: 98.3 F (36.8 C) (02/25 2311) BP: 176/66 (02/26 0300) Pulse Rate: 75 (02/26 0300)  Labs: Recent Labs    07/04/23 2343  HGB 13.0  HCT 39.3  PLT 240  CREATININE 0.78  TROPONINIHS 12    Estimated Creatinine Clearance: 98.1 mL/min (by C-G formula based on SCr of 0.78 mg/dL).   Medical History: Past Medical History:  Diagnosis Date   Diabetes mellitus without complication (HCC)    Hypertension     Assessment: Frederick Harris is a 73 y.o. year old male admitted on 07/05/2023 with concern for acute PE. CTA with small nonocclusive pulmonary embolus (no R heart strain noted). No anticoagulation prior to admission. Pharmacy consulted to dose heparin.  Goal of Therapy:  Heparin level 0.3-0.7 units/ml Monitor platelets by anticoagulation protocol: Yes   Plan:  Heparin 6000 units x 1 as bolus followed by heparin infusion at 1400 units/hr 6 heparin level  Daily heparin level, CBC, and monitoring for bleeding F/u plans for anticoagulation   Thank you for allowing pharmacy to participate in this patient's care.  Marja Kays, PharmD Emergency Medicine Clinical Pharmacist 07/05/2023,4:08 AM

## 2023-07-05 NOTE — Progress Notes (Signed)
  Echocardiogram 2D Echocardiogram has been performed.  Frederick Harris Frederick Harris 07/05/2023, 3:29 PM

## 2023-07-05 NOTE — ED Notes (Signed)
 Pt reports mid-back pain last night that increased when he took a deep breath Pt does work out and states he could have strained a muscle and has in the past.   Pt states he is in no pain at present time

## 2023-07-05 NOTE — Progress Notes (Signed)
 PHARMACY - ANTICOAGULATION CONSULT NOTE  Pharmacy Consult for IV heparin Indication: pulmonary embolus  Allergies  Allergen Reactions   Iodine Other (See Comments)   Shellfish Allergy Hives, Itching and Swelling    Patient Measurements: Height: 5\' 10"  (177.8 cm) Weight: 95.3 kg (210 lb) IBW/kg (Calculated) : 73 Heparin Dosing Weight: 92.5 kg  Vital Signs: Temp: 98.4 F (36.9 C) (02/26 1326) Temp Source: Oral (02/26 1326) BP: 160/69 (02/26 1326) Pulse Rate: 80 (02/26 1326)  Labs: Recent Labs    07/04/23 2343 07/05/23 0407 07/05/23 1314 07/05/23 1440  HGB 13.0  --   --  11.6*  HCT 39.3  --   --  34.9*  PLT 240  --   --  229  HEPARINUNFRC  --   --  0.28*  --   CREATININE 0.78  --   --   --   TROPONINIHS 12 14  --   --     Estimated Creatinine Clearance: 98.1 mL/min (by C-G formula based on SCr of 0.78 mg/dL).   Medical History: Past Medical History:  Diagnosis Date   Diabetes mellitus without complication (HCC)    Hypertension     Assessment: Frederick Harris is a 73 y.o. year old male admitted on 07/05/2023 with concern for acute PE. CTA with small nonocclusive pulmonary embolus (no R heart strain noted). No anticoagulation prior to admission. Pharmacy consulted to dose heparin.  HL 0.28 - subtherapeutic on 1400 units/hr  Goal of Therapy:  Heparin level 0.3-0.7 units/ml Monitor platelets by anticoagulation protocol: Yes   Plan:  Increase heparin infusion to 1600 units/hr 6 heparin level  Daily heparin level, CBC, and monitoring for bleeding F/u plans for anticoagulation   Thank you for allowing pharmacy to participate in this patient's care.  Calton Dach, PharmD, BCCCP Clinical Pharmacist 07/05/2023 3:57 PM

## 2023-07-05 NOTE — ED Provider Notes (Signed)
 [place] [facility] Provider Note   CSN: [id number] Arrival date & time: 07/04/23  2300     History  Chief Complaint  Patient presents with   Back Pain    Frederick Harris is a 73 y.o. male.  The history is provided by the patient.  Back Pain Location:  Thoracic spine (right back) Quality:  Cramping Radiates to:  Does not radiate Pain severity:  Moderate Pain is:  Same all the time Onset quality:  Sudden Timing:  Constant Progression:  Resolved Chronicity:  New Context: not recent illness, not recent injury and not twisting   Relieved by:  Nothing Worsened by:  Nothing Ineffective treatments:  None tried Associated symptoms: no fever, no perianal numbness, no weakness and no weight loss   Risk factors: no hx of cancer   Risk factors comment:  Left leg swelling at ankle. Patient with DM and HTN with right back pain. Resolved.       Home Medications Prior to Admission medications   Medication Sig Start Date End Date Taking? Authorizing Provider  BLACK CURRANT SEED OIL PO Take 1 capsule by mouth daily.    [provider]  Coenzyme Q10 (COQ10 PO) Take 1 tablet by mouth daily.    [provider]  Cyanocobalamin (VITAMIN B-12 PO) Take 1 tablet by mouth daily.    [provider]  cyclobenzaprine (FLEXERIL) 10 MG tablet Take 1 tablet (10 mg total) by mouth at bedtime. 08/08/17   Kirtland Bouchard, PA-C  losartan-hydrochlorothiazide (HYZAAR) 100-25 MG tablet Take 1 tablet by mouth daily. 07/30/18   [provider]  MAGNESIUM PO Take 1 tablet by mouth daily.    [provider]  metFORMIN (GLUCOPHAGE) 500 MG tablet Take by mouth 2 (two) times daily with a meal.    [provider]  polyvinyl alcohol (LIQUIFILM TEARS) 1.4 % ophthalmic solution Place 1 drop into both eyes as needed for dry eyes.    [provider]  traMADol (ULTRAM) 50 MG tablet Take 1-2 tablets (50-100 mg total) by mouth  every 6 (six) hours as needed. 08/23/19   Kathryne Hitch, MD      Allergies    Iodine and Shellfish allergy    Review of Systems   Review of Systems  Constitutional:  Negative for fever and weight loss.  Respiratory:  Negative for shortness of breath, wheezing and stridor.   Cardiovascular:  Positive for leg swelling.  Musculoskeletal:  Positive for back pain.  Neurological:  Negative for weakness.  All other systems reviewed and are negative.   Physical Exam Updated Vital Signs BP (!) 176/66   Pulse 75   Temp 98.3 F (36.8 C)   Resp 18   Ht 5\' 10"  (1.778 m)   Wt 95.3 kg   SpO2 95%   BMI 30.13 kg/m  Physical Exam Vitals and nursing note reviewed.  Constitutional:      General: He is not in acute distress.    Appearance: Normal appearance. He is well-developed. He is not diaphoretic.  HENT:     Head: Normocephalic and atraumatic.     Nose: Nose normal.  Eyes:     Conjunctiva/sclera: Conjunctivae normal.     Pupils: Pupils are equal, round, and reactive to light.  Cardiovascular:     Rate and Rhythm: Normal rate and regular rhythm.     Pulses: Normal pulses.     Heart sounds: Normal heart sounds.  Pulmonary:  Effort: Pulmonary effort is normal.     Breath sounds: Normal breath sounds. No wheezing or rales.  Abdominal:     General: Bowel sounds are normal.     Palpations: Abdomen is soft.     Tenderness: There is no abdominal tenderness. There is no guarding or rebound.  Musculoskeletal:        General: Normal range of motion.     Cervical back: Normal range of motion and neck supple.       Legs:  Skin:    General: Skin is warm and dry.     Capillary Refill: Capillary refill takes less than 2 seconds.  Neurological:     General: No focal deficit present.     Mental Status: He is alert and oriented to person, place, and time.     Deep Tendon Reflexes: Reflexes normal.  Psychiatric:        Mood and Affect: Mood normal.     ED Results /  Procedures / Treatments   Labs (all labs ordered are listed, but only abnormal results are displayed) Results for orders placed or performed during the hospital encounter of 07/05/23  CBC with Differential   Collection Time: 07/04/23 11:43 PM  Result Value Ref Range   WBC 7.7 4.0 - 10.5 K/uL   RBC 4.64 4.22 - 5.81 MIL/uL   Hemoglobin 13.0 13.0 - 17.0 g/dL   HCT 16.1 09.6 - 04.5 %   MCV 84.7 80.0 - 100.0 fL   MCH 28.0 26.0 - 34.0 pg   MCHC 33.1 30.0 - 36.0 g/dL   RDW 40.9 81.1 - 91.4 %   Platelets 240 150 - 400 K/uL   nRBC 0.0 0.0 - 0.2 %   Neutrophils Relative % 67 %   Neutro Abs 5.2 1.7 - 7.7 K/uL   Lymphocytes Relative 20 %   Lymphs Abs 1.5 0.7 - 4.0 K/uL   Monocytes Relative 11 %   Monocytes Absolute 0.8 0.1 - 1.0 K/uL   Eosinophils Relative 1 %   Eosinophils Absolute 0.1 0.0 - 0.5 K/uL   Basophils Relative 1 %   Basophils Absolute 0.0 0.0 - 0.1 K/uL   Immature Granulocytes 0 %   Abs Immature Granulocytes 0.02 0.00 - 0.07 K/uL  Basic metabolic panel   Collection Time: 07/04/23 11:43 PM  Result Value Ref Range   Sodium 131 (L) 135 - 145 mmol/L   Potassium 3.7 3.5 - 5.1 mmol/L   Chloride 97 (L) 98 - 111 mmol/L   CO2 23 22 - 32 mmol/L   Glucose, Bld 147 (H) 70 - 99 mg/dL   BUN 15 8 - 23 mg/dL   Creatinine, Ser 7.82 0.61 - 1.24 mg/dL   Calcium 9.1 8.9 - 95.6 mg/dL   GFR, Estimated >21 >30 mL/min   Anion gap 11 5 - 15  D-dimer, quantitative   Collection Time: 07/04/23 11:43 PM  Result Value Ref Range   D-Dimer, Quant 2.71 (H) 0.00 - 0.50 ug/mL-FEU  Troponin I (High Sensitivity)   Collection Time: 07/04/23 11:43 PM  Result Value Ref Range   Troponin I (High Sensitivity) 12 <18 ng/L  Troponin I (High Sensitivity)   Collection Time: 07/05/23  4:07 AM  Result Value Ref Range   Troponin I (High Sensitivity) 14 <18 ng/L   CT Angio Chest PE W and/or Wo Contrast Result Date: 07/05/2023 CLINICAL DATA:  Shortness of breath EXAM: CT ANGIOGRAPHY CHEST WITH CONTRAST  TECHNIQUE: Multidetector CT imaging of the chest was performed using  the standard protocol during bolus administration of intravenous contrast. Multiplanar CT image reconstructions and MIPs were obtained to evaluate the vascular anatomy. RADIATION DOSE REDUCTION: This exam was performed according to the departmental dose-optimization program which includes automated exposure control, adjustment of the mA and/or kV according to patient size and/or use of iterative reconstruction technique. CONTRAST:  75mL OMNIPAQUE IOHEXOL 350 MG/ML SOLN COMPARISON:  None Available. FINDINGS: Cardiovascular: Heart is normal size. Aorta is normal caliber. Partially occlusive small pulmonary embolus noted in the right lower lobe. No pulmonary emboli on the left. Scattered aortic calcifications. Mediastinum/Nodes: No mediastinal, hilar, or axillary adenopathy. Trachea and esophagus are unremarkable. Thyroid unremarkable. Lungs/Pleura: Trace right pleural effusion. Right base atelectasis. Left lung clear. Upper Abdomen: No acute findings Musculoskeletal: Mild bilateral gynecomastia. No acute bony abnormality. Review of the MIP images confirms the above findings. IMPRESSION: Small filling defect in the right lower lobe compatible with small nonocclusive pulmonary embolus. Trace right pleural effusion, right base atelectasis. Aortic Atherosclerosis (ICD10-I70.0). Electronically Signed   By: Charlett Nose M.D.   On: 07/05/2023 03:50   DG Chest 2 View Result Date: 07/05/2023 CLINICAL DATA:  Right lower back pain EXAM: CHEST - 2 VIEW COMPARISON:  None Available. FINDINGS: The heart size and mediastinal contours are within normal limits. Both lungs are clear. The visualized skeletal structures are unremarkable. IMPRESSION: No active cardiopulmonary disease. Electronically Signed   By: Charlett Nose M.D.   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  Moderate to moderately severe arthritic changes left hip.  Bilateral hips well located.  No acute fractures.  No evidence of AVN.   EKG  EKG Interpretation Date/Time:  Tuesday July 04 2023 23:47:34 EST Ventricular Rate:  90 PR Interval:  194 QRS Duration:  102 QT Interval:  350 QTC Calculation: 428 R Axis:   21  Text Interpretation: Sinus rhythm with occasional Premature ventricular complexes Moderate voltage criteria for LVH, may be normal variant ( R in aVL , Sokolow-Lyon ) Confirmed by Nicanor Alcon, Zuma Hust ([id number]) on 07/05/2023 4:50:44 AM        Radiology CT Angio Chest PE W and/or Wo Contrast Result Date: 07/05/2023 CLINICAL DATA:  Shortness of breath EXAM: CT ANGIOGRAPHY CHEST WITH CONTRAST TECHNIQUE: Multidetector CT imaging of the chest was performed using the standard protocol during bolus administration of intravenous contrast. Multiplanar CT image reconstructions and MIPs were obtained to evaluate the vascular anatomy. RADIATION DOSE REDUCTION: This exam was performed according to the departmental dose-optimization program which includes automated exposure control, adjustment of the mA and/or kV according to patient size and/or use of iterative reconstruction technique. CONTRAST:  75mL OMNIPAQUE IOHEXOL 350 MG/ML SOLN COMPARISON:  None Available. FINDINGS: Cardiovascular: Heart is normal size. Aorta is normal caliber. Partially occlusive small pulmonary embolus noted in the right lower lobe. No pulmonary emboli on the left. Scattered aortic calcifications. Mediastinum/Nodes: No mediastinal, hilar, or axillary adenopathy. Trachea and esophagus are unremarkable. Thyroid unremarkable. Lungs/Pleura: Trace right pleural effusion. Right base atelectasis. Left lung clear. Upper Abdomen: No acute findings Musculoskeletal: Mild bilateral gynecomastia. No acute bony abnormality. Review of the MIP images confirms the above findings.  IMPRESSION: Small filling defect in the right lower lobe compatible with small nonocclusive pulmonary embolus. Trace right pleural effusion, right base atelectasis. Aortic Atherosclerosis (ICD10-I70.0). Electronically Signed   By: Charlett Nose M.D.   On: 07/05/2023  03:50   DG Chest 2 View Result Date: 07/05/2023 CLINICAL DATA:  Right lower back pain EXAM: CHEST - 2 VIEW COMPARISON:  None Available. FINDINGS: The heart size and mediastinal contours are within normal limits. Both lungs are clear. The visualized skeletal structures are unremarkable. IMPRESSION: No active cardiopulmonary disease. Electronically Signed   By: Charlett Nose M.D.   On: 07/05/2023 00:25    Procedures .Critical Care  Performed by: Cy Blamer, MD Authorized by: Cy Blamer, MD   Critical care provider statement:    Critical care time (minutes):  30   Critical care end time:  07/05/2023 4:49 AM   Critical care was time spent personally by me on the following activities:  Development of treatment plan with patient or surrogate, discussions with consultants, evaluation of patient's response to treatment, examination of patient, ordering and review of laboratory studies, ordering and review of radiographic studies, ordering and performing treatments and interventions, pulse oximetry, re-evaluation of patient's condition and review of old charts   Care discussed with: admitting provider       Medications Ordered in ED Medications  heparin ADULT infusion 100 units/mL (25000 units/211mL) (1,400 Units/hr Intravenous New Bag/Given 07/05/23 0428)  losartan-hydrochlorothiazide (HYZAAR) 100-25 MG per tablet 1 tablet (has no administration in time range)  iohexol (OMNIPAQUE) 350 MG/ML injection 75 mL (75 mLs Intravenous Contrast Given 07/05/23 0327)  heparin bolus via infusion 6,000 Units (6,000 Units Intravenous Bolus from Bag 07/05/23 0428)    ED Course/ Medical Decision Making/ A&P                                 Medical  Decision Making Patient with right thoracic back pain   Amount and/or Complexity of Data Reviewed External Data Reviewed: notes.    Details: Previous notes reviewed  Labs: ordered.    Details: Sodium slight low 131, normal potassium 3.7, normal creatinine 0.78, normal white count 7.7 normal hemoglobin 13, normal platelets.  Normal troponin 12/14. Elevated ddimer 2.71  Radiology: ordered and independent interpretation performed.    Details: PE by me  ECG/medicine tests: ordered and independent interpretation performed. Decision-making details documented in ED Course.  Risk Prescription drug management. Decision regarding hospitalization.    Final Clinical Impression(s) / ED Diagnoses Final diagnoses:  Other pulmonary embolism without acute cor pulmonale, unspecified chronicity (HCC)   The patient appears reasonably stabilized for admission considering the current resources, flow, and capabilities available in the ED at this time, and I doubt any other [facility] requiring further screening and/or treatment in the ED prior to admission.  Rx / DC Orders ED Discharge Orders     None         Nekeshia Lenhardt, MD 07/05/23 [phone]

## 2023-07-05 NOTE — Progress Notes (Signed)
 Assumed care of patient. Pt is alert and oriented x4. RA. Vitals stable. Currently has heparin running at 7mls/ hr. No complaints of pain. Telemetry placed on patient and called in to telemetry. Pt oriented to room and call light. Bed in lowest position and call light within reach.

## 2023-07-05 NOTE — ED Notes (Signed)
 Patient transported to CT

## 2023-07-05 NOTE — Plan of Care (Signed)

## 2023-07-06 ENCOUNTER — Observation Stay (HOSPITAL_BASED_OUTPATIENT_CLINIC_OR_DEPARTMENT_OTHER): Payer: Medicare Other

## 2023-07-06 ENCOUNTER — Telehealth (HOSPITAL_COMMUNITY): Payer: Self-pay | Admitting: Pharmacy Technician

## 2023-07-06 ENCOUNTER — Other Ambulatory Visit (HOSPITAL_COMMUNITY): Payer: Self-pay

## 2023-07-06 DIAGNOSIS — Z86711 Personal history of pulmonary embolism: Secondary | ICD-10-CM

## 2023-07-06 DIAGNOSIS — I2699 Other pulmonary embolism without acute cor pulmonale: Secondary | ICD-10-CM | POA: Diagnosis not present

## 2023-07-06 LAB — CBC
HCT: 35.9 % — ABNORMAL LOW (ref 39.0–52.0)
Hemoglobin: 11.9 g/dL — ABNORMAL LOW (ref 13.0–17.0)
MCH: 27.9 pg (ref 26.0–34.0)
MCHC: 33.1 g/dL (ref 30.0–36.0)
MCV: 84.3 fL (ref 80.0–100.0)
Platelets: 227 10*3/uL (ref 150–400)
RBC: 4.26 MIL/uL (ref 4.22–5.81)
RDW: 13.2 % (ref 11.5–15.5)
WBC: 6.1 10*3/uL (ref 4.0–10.5)
nRBC: 0 % (ref 0.0–0.2)

## 2023-07-06 LAB — BASIC METABOLIC PANEL
Anion gap: 10 (ref 5–15)
BUN: 13 mg/dL (ref 8–23)
CO2: 24 mmol/L (ref 22–32)
Calcium: 9 mg/dL (ref 8.9–10.3)
Chloride: 100 mmol/L (ref 98–111)
Creatinine, Ser: 0.8 mg/dL (ref 0.61–1.24)
GFR, Estimated: >60 mL/min (ref >60)
Glucose, Bld: 125 mg/dL — ABNORMAL HIGH (ref 70–99)
Potassium: 4.2 mmol/L (ref 3.5–5.1)
Sodium: 134 mmol/L — ABNORMAL LOW (ref 135–145)

## 2023-07-06 LAB — GLUCOSE, CAPILLARY
Glucose-Capillary: 168 mg/dL — ABNORMAL HIGH (ref 70–99)
Glucose-Capillary: 111 mg/dL — ABNORMAL HIGH (ref 70–99)
Glucose-Capillary: 122 mg/dL — ABNORMAL HIGH (ref 70–99)
Glucose-Capillary: 121 mg/dL — ABNORMAL HIGH (ref 70–99)

## 2023-07-06 LAB — HEPARIN LEVEL (UNFRACTIONATED): Heparin Unfractionated: 0.61 [IU]/mL (ref 0.30–0.70)

## 2023-07-06 LAB — HEMOGLOBIN A1C
Hgb A1c MFr Bld: 6.8 % — ABNORMAL HIGH (ref 4.8–5.6)
Mean Plasma Glucose: 148.46 mg/dL

## 2023-07-06 MED ORDER — CARVEDILOL 3.125 MG PO TABS
3.1250 mg | ORAL_TABLET | Freq: Two times a day (BID) | ORAL | Status: DC
  Administered 2023-07-06 – 2023-07-07 (×3): 3.125 mg via ORAL
  Filled 2023-07-06 (×3): qty 1

## 2023-07-06 MED ORDER — APIXABAN 5 MG PO TABS: 5.0000 mg | ORAL_TABLET | Freq: Two times a day (BID) | ORAL | Status: DC

## 2023-07-06 MED ORDER — APIXABAN 5 MG PO TABS
10.0000 mg | ORAL_TABLET | Freq: Two times a day (BID) | ORAL | Status: DC
  Administered 2023-07-06 – 2023-07-07 (×3): 10 mg via ORAL
  Filled 2023-07-06 (×3): qty 2

## 2023-07-06 MED ORDER — INSULIN ASPART 100 UNIT/ML IJ SOLN: 0.0000 [IU] | Freq: Three times a day (TID) | INTRAMUSCULAR | Status: DC

## 2023-07-06 NOTE — Plan of Care (Signed)
   Problem: Health Behavior/Discharge Planning: Goal: Ability to manage health-related needs will improve Outcome: Progressing   Problem: Activity: Goal: Risk for activity intolerance will decrease Outcome: Progressing   Problem: Coping: Goal: Level of anxiety will decrease Outcome: Progressing

## 2023-07-06 NOTE — Care Management Obs Status (Signed)
 MEDICARE OBSERVATION STATUS NOTIFICATION   Patient Details  Name: HERMEN MARIO MRN: [id number] Date of Birth: Nov 28, 1950   Medicare Observation Status Notification Given:       Gordy Clement, RN 07/06/2023, 11:17 AM

## 2023-07-06 NOTE — Care Management CC44 (Signed)
 Condition Code 44 Documentation Completed  Patient Details  Name: Frederick Harris MRN: [id number] Date of Birth: 04-Apr-1951   Condition Code 44 given:  Yes Patient signature on Condition Code 44 notice:  Yes Documentation of 2 MD's agreement:  Yes Code 44 added to claim:  Yes    Gordy Clement, RN 07/06/2023, 11:18 AM

## 2023-07-06 NOTE — Progress Notes (Signed)
 PHARMACY - ANTICOAGULATION CONSULT NOTE  Pharmacy Consult for IV heparin Indication: pulmonary embolus  Allergies  Allergen Reactions   Iodine Other (See Comments)   Shellfish Allergy Hives, Itching and Swelling    Patient Measurements: Height: 5\' 10"  (177.8 cm) Weight: 95.3 kg (210 lb) IBW/kg (Calculated) : 73 Heparin Dosing Weight: 92.5 kg  Vital Signs: Temp: 97.6 F (36.4 C) (02/27 0405) Temp Source: Oral (02/27 0405) BP: 121/47 (02/27 0405) Pulse Rate: 80 (02/27 0405)  Labs: Recent Labs    07/04/23 2343 07/05/23 0407 07/05/23 1314 07/05/23 1440 07/05/23 2202 07/06/23 0525  HGB 13.0  --   --  11.6*  --  11.9*  HCT 39.3  --   --  34.9*  --  35.9*  PLT 240  --   --  229  --  227  HEPARINUNFRC  --   --  0.28*  --  0.47 0.61  CREATININE 0.78  --   --  0.86  --  0.80  TROPONINIHS 12 14  --   --   --   --     Estimated Creatinine Clearance: 98.1 mL/min (by C-G formula based on SCr of 0.8 mg/dL).   Medical History: Past Medical History:  Diagnosis Date   Diabetes mellitus without complication (HCC)    Hypertension     Assessment: Frederick Harris is a 72 y.o. year old male admitted on 07/05/2023 with concern for acute PE. CTA with small nonocclusive pulmonary embolus (no R heart strain noted). No anticoagulation prior to admission. Pharmacy consulted to dose heparin.  2/27 AM: Heparin level therapeutic x2 at 0.61 with heparin running at 1600 units/hour. CBC stable. No signs of bleeding or issues with the heparin infusion noted.    Goal of Therapy:  Heparin level 0.3-0.7 units/ml Monitor platelets by anticoagulation protocol: Yes   Plan:  Continue heparin infusion at 1600 units/hour  Daily heparin level, CBC, and monitoring for bleeding F/u plans for anticoagulation   Thank you for allowing pharmacy to participate in this patient's care.  Jani Gravel, PharmD Clinical Pharmacist  07/06/2023 6:51 AM

## 2023-07-06 NOTE — Telephone Encounter (Signed)
 Patient Product/process development scientist completed.    The patient is insured through [employer]. Patient has Medicare and is not eligible for a copay card, but may be able to apply for patient assistance or Medicare RX Payment Plan (Patient Must reach out to their plan, if eligible for payment plan), if available.    Ran test claim for Eliquis Starter Pack and the current 30 day co-pay is $617.55 due to a $590.00 deductible.   This test claim was processed through [facility]- copay amounts may vary at other pharmacies due to pharmacy/plan contracts, or as the patient moves through the different stages of their insurance plan.     Roland Earl, CPHT Pharmacy Technician III Certified Patient Advocate [facility] Patient Advocate Team Direct Number: [phone]  Fax: [phone]

## 2023-07-06 NOTE — Progress Notes (Signed)
 PHARMACY - ANTICOAGULATION CONSULT NOTE  Pharmacy Consult for IV heparin Indication: pulmonary embolus  Allergies  Allergen Reactions   Iodine Other (See Comments)   Shellfish Allergy Hives, Itching and Swelling    Patient Measurements: Height: 5\' 10"  (177.8 cm) Weight: 95.3 kg (210 lb) IBW/kg (Calculated) : 73 Heparin Dosing Weight: 92.5 kg  Vital Signs: Temp: 97.6 F (36.4 C) (02/27 0405) Temp Source: Oral (02/27 0405) BP: 121/47 (02/27 0405) Pulse Rate: 80 (02/27 0405)  Labs: Recent Labs    07/04/23 2343 07/05/23 0407 07/05/23 1314 07/05/23 1440 07/05/23 2202 07/06/23 0525  HGB 13.0  --   --  11.6*  --  11.9*  HCT 39.3  --   --  34.9*  --  35.9*  PLT 240  --   --  229  --  227  HEPARINUNFRC  --   --  0.28*  --  0.47 0.61  CREATININE 0.78  --   --  0.86  --  0.80  TROPONINIHS 12 14  --   --   --   --     Estimated Creatinine Clearance: 98.1 mL/min (by C-G formula based on SCr of 0.8 mg/dL).   Medical History: Past Medical History:  Diagnosis Date   Diabetes mellitus without complication (HCC)    Hypertension     Assessment: Frederick Harris is a 73 y.o. year old male admitted on 07/05/2023 with concern for acute PE. CTA with small nonocclusive pulmonary embolus (no R heart strain noted). No anticoagulation prior to admission. Pharmacy consulted to dose heparin.  2/27 AM: Heparin level therapeutic x2 at 0.61 with heparin running at 1600 units/hour. CBC stable. No signs of bleeding or issues with the heparin infusion noted.    Goal of Therapy:  Heparin level 0.3-0.7 units/ml Monitor platelets by anticoagulation protocol: Yes   Plan:  Continue heparin infusion at 1600 units/hour  Daily heparin level, CBC, and monitoring for bleeding F/u plans for anticoagulation   Thank you for allowing pharmacy to participate in this patient's care.  ADDENDUM: Dr. Thedore Mins OK with transition to Eliquis for PE.   - STOP heparin infusion - START apixaban 10 mg BID x 7  days, followed by 5 mg BID- administer first dose at the same time heparin infusion is stopped  - Plan communicated to primary RN  - AVS posted, needs Ed    Jani Gravel, PharmD Clinical Pharmacist  07/06/2023 8:16 AM

## 2023-07-06 NOTE — Discharge Instructions (Signed)
 Information on my medicine - ELIQUIS (apixaban)  Why was Eliquis prescribed for you? Eliquis was prescribed to treat blood clots that may have been found in the veins of your legs (deep vein thrombosis) or in your lungs (pulmonary embolism) and to reduce the risk of them occurring again.  What do You need to know about Eliquis ? The starting dose is 10 mg (two 5 mg tablets) taken TWICE daily for the FIRST SEVEN (7) DAYS, then on  07/13/23  the dose is reduced to ONE 5 mg tablet taken TWICE daily.  Eliquis may be taken with or without food.   Try to take the dose about the same time in the morning and in the evening. If you have difficulty swallowing the tablet whole please discuss with your pharmacist how to take the medication safely.  Take Eliquis exactly as prescribed and DO NOT stop taking Eliquis without talking to the doctor who prescribed the medication.  Stopping may increase your risk of developing a new blood clot.  Refill your prescription before you run out.  After discharge, you should have regular check-up appointments with your healthcare provider that is prescribing your Eliquis.    What do you do if you miss a dose? If a dose of ELIQUIS is not taken at the scheduled time, take it as soon as possible on the same day and twice-daily administration should be resumed. The dose should not be doubled to make up for a missed dose.  Important Safety Information A possible side effect of Eliquis is bleeding. You should call your healthcare provider right away if you experience any of the following: Bleeding from an injury or your nose that does not stop. Unusual colored urine (red or dark brown) or unusual colored stools (red or black). Unusual bruising for unknown reasons. A serious fall or if you hit your head (even if there is no bleeding).  Some medicines may interact with Eliquis and might increase your risk of bleeding or clotting while on Eliquis. To help avoid this,  consult your healthcare provider or pharmacist prior to using any new prescription or non-prescription medications, including herbals, vitamins, non-steroidal anti-inflammatory drugs (NSAIDs) and supplements.  This website has more information on Eliquis (apixaban): http://www.eliquis.com/eliquis/home     Follow with Primary MD Knox Royalty, MD in 7 days   Get CBC, CMP, 2 view Chest X ray -  checked next visit with your primary MD   Activity: As tolerated with Full fall precautions use walker/cane & assistance as needed  Disposition Home    Diet: Heart Healthy    Special Instructions: If you have smoked or chewed Tobacco  in the last 2 yrs please stop smoking, stop any regular Alcohol  and or any Recreational drug use.  On your next visit with your primary care physician please Get Medicines reviewed and adjusted.  Please request your Prim.MD to go over all Hospital Tests and Procedure/Radiological results at the follow up, please get all Hospital records sent to your Prim MD by signing hospital release before you go home.  If you experience worsening of your admission symptoms, develop shortness of breath, life threatening emergency, suicidal or homicidal thoughts you must seek medical attention immediately by calling 911 or calling your MD immediately  if symptoms less severe.  You Must read complete instructions/literature along with all the possible adverse reactions/side effects for all the Medicines you take and that have been prescribed to you. Take any new Medicines after you have completely understood  and accpet all the possible adverse reactions/side effects.   Do not drive when taking Pain medications.  Do not take more than prescribed Pain, Sleep and Anxiety Medications

## 2023-07-06 NOTE — Care Management CC44 (Signed)
 Condition Code 44 Documentation Completed  Patient Details  Name: AZAZEL FRANZE MRN: [id number] Date of Birth: October 25, 1950   Condition Code 44 given:  Yes Patient signature on Condition Code 44 notice:  Yes Documentation of 2 MD's agreement:  Yes Code 44 added to claim:  Yes    Gordy Clement, RN 07/06/2023, 11:14 AM

## 2023-07-06 NOTE — Inpatient Diabetes Management (Addendum)
 Inpatient Diabetes Program Recommendations  AACE/ADA: New Consensus Statement on Inpatient Glycemic Control  Target Ranges:  Prepandial:   less than 140 mg/dL      Peak postprandial:   less than 180 mg/dL (1-2 hours)      Critically ill patients:  140 - 180 mg/dL    Latest Reference Range & Units 07/05/23 14:06 07/05/23 15:46 07/06/23 09:20 07/06/23 12:34  Glucose-Capillary 70 - 99 mg/dL 161 (H) 096 (H) 045 (H) 111 (H)    Latest Reference Range & Units 07/04/23 23:43 07/05/23 14:40 07/06/23 05:25  Glucose 70 - 99 mg/dL 409 (H) 811 (H) 914 (H)    Latest Reference Range & Units 07/06/23 05:25  Hemoglobin A1C 4.8 - 5.6 % 6.8 (H)   Review of Glycemic Control  Diabetes history: DM2 Outpatient Diabetes medications: Metformin XR 500 mg daily Current orders for Inpatient glycemic control: Novolog 0-9 units TID with meals  Inpatient Diabetes Program Recommendations:    Insulin: Novolog correction insulin ordered this morning. CBGs and glucose have ranged from 116-168 mg/dl since arrival (without any DM medications since arrival).  HbgA1C:  A1C 6.8% on 07/06/23 indicating an average glucose of 148 mg/dl. Current A1C within ADA standard goal of 7% or less.   NOTE: Noted consult for diabetes coordinator. Diabetes coordinator working remotely. Attempted to call patient on room phone several times today but no answer. A1C 6.8% on 07/06/23 indicating an average glucose of 148 mg/dl indicating good DM control.   Thanks, Orlando Penner, RN, MSN, CDCES Diabetes Coordinator Inpatient Diabetes Program [phone] (Team Pager from 8am to 5pm)

## 2023-07-06 NOTE — Care Management CC44 (Signed)
 Condition Code 44 Documentation Completed  Patient Details  Name: Frederick Harris MRN: [id number] Date of Birth: June 07, 1950   Condition Code 44 given:    Patient signature on Condition Code 44 notice:    Documentation of 2 MD's agreement:    Code 44 added to claim:       Gordy Clement, RN 07/06/2023, 11:12 AM

## 2023-07-06 NOTE — Progress Notes (Signed)
 VASCULAR LAB    Bilateral lower extremity venous duplex has been performed.  See CV proc for preliminary results.   Isla Sabree, RVT 07/06/2023, 4:43 PM

## 2023-07-06 NOTE — Progress Notes (Addendum)
 PROGRESS NOTE                                                                                                                                                                                                             Patient Demographics:    Frederick Harris, is a 73 y.o. male, DOB - 1951/04/17, RUE:[id number]  Outpatient Primary MD for the patient is Knox Royalty, MD    LOS - 1  Admit date - 07/05/2023    Chief Complaint  Patient presents with   Back Pain       Brief Narrative (HPI from H&P)   73 y.o. male with medical history significant of hypertension, diabetes presenting with back pain.   Patient presented with 1 day of right-sided mid back pain.  Noted to have episodes of pleuritic pain as well.  No significant pain on presentation to the ED. workup consistent of small right lower lobe PE.  He was admitted for further care.   Subjective:    Fuller Plan today has, No headache, No chest pain, No abdominal pain - No Nausea, No new weakness tingling or numbness, no SOB   Assessment  & Plan :   Acute right lower lobe subsegmental PE > Patient presented primarily with back pain and pleuritic pain, CTA noted, echo shows stable RV function no signs of RV dysfunction, lower extremity venous duplex pending, on heparin drip with transition to Eliquis, if stable likely discharge tomorrow.  He has no identifiable inciting factors except for his age, will have him follow-up with hematology outpatient postdischarge for any secondary hypercoagulable state workup.   Hypertension - Continue home losartan, hydrochlorothiazide added low-dose Coreg as he now has new diagnosis of chronic systolic CHF.   Chronic systolic CHF.  Currently compensated EF around 40%.  Continue home ARB and diuretic, add Coreg, already now on Eliquis, will have him follow-up with cardiology outpatient for ischemic workup if needed.    DM type II.  Diet controlled  add ISS here.  Hold metformin due to recent IV dye load.   No results found for: "HGBA1C" CBG (last 3)  Recent Labs    07/05/23 1406 07/05/23 1546 07/06/23 0920  GLUCAP 141* 116* 168*        Condition - Extremely Guarded  Family Communication  : None present  Code Status : Full  code  Consults  : None  PUD Prophylaxis :    Procedures  :     Bilateral lower extremity venous duplex.    CTA - small right lower lobe PE.  TTE. 1. Left ventricular ejection fraction, by estimation, is 45 to 50%. Left ventricular ejection fraction by 3D volume is 40 % though this is mildly underestimated.. Left ventricular ejection fraction by 2D MOD biplane is 46.8 %. The left ventricle has mildly decreased function. The left ventricle has global hypokinesis. Left ventricular diastolic parameters are consistent with Grade I diastolic dysfunction (impaired relaxation). The average left ventricular global longitudinal strain is -10.6 %. The global longitudinal strain is abnormal.  2. Right ventricular systolic function is normal. The right ventricular size is mildly enlarged.  3. Right atrial size was mildly dilated.  4. The mitral valve is normal in structure. No evidence of mitral valve regurgitation. No evidence of mitral stenosis.  5. The aortic valve is normal in structure. Aortic valve regurgitation is mild to moderate. No aortic stenosis is present.  6. The inferior vena cava is normal in size with greater than 50% respiratory variability, suggesting right atrial pressure of 3 mmHg.      Disposition Plan  :    Status is: Inpatient  DVT Prophylaxis  :     apixaban (ELIQUIS) tablet 10 mg  apixaban (ELIQUIS) tablet 5 mg     Lab Results  Component Value Date   PLT 227 07/06/2023    Diet :  Diet Order             Diet regular Room service appropriate? Yes; Fluid consistency: Thin  Diet effective now                    Inpatient Medications  Scheduled Meds:  apixaban  10 mg  Oral BID   Followed by   Melene Muller ON 07/13/2023] apixaban  5 mg Oral BID   carvedilol  3.125 mg Oral BID WC   losartan  100 mg Oral Daily   And   hydrochlorothiazide  25 mg Oral Daily   sodium chloride flush  3 mL Intravenous Q12H   Continuous Infusions:  heparin Stopped (07/06/23 0901)   PRN Meds:.acetaminophen **OR** acetaminophen, oxymetazoline, polyethylene glycol, polyvinyl alcohol  Antibiotics  :    Anti-infectives (From admission, onward)    None         Objective:   Vitals:   07/05/23 1326 07/05/23 1950 07/05/23 2329 07/06/23 0405  BP: (!) 160/69 (!) 135/58 (!) 122/57 (!) 121/47  Pulse: 80 68 68 80  Resp: 18 20 17 20   Temp: 98.4 F (36.9 C) 97.7 F (36.5 C) 97.7 F (36.5 C) 97.6 F (36.4 C)  TempSrc: Oral Oral Oral Oral  SpO2: 97% 95% 96% 94%  Weight:      Height:        Wt Readings from Last 3 Encounters:  07/04/23 95.3 kg  11/15/21 100.4 kg  01/20/21 93.9 kg     Intake/Output Summary (Last 24 hours) at 07/06/2023 2536 Last data filed at 07/05/2023 2329 Gross per 24 hour  Intake 3 ml  Output 900 ml  Net -897 ml     Physical Exam  Awake Alert, No new F.N deficits, Normal affect [place].AT,PERRAL Supple Neck, No JVD,   Symmetrical Chest wall movement, Good air movement bilaterally, CTAB RRR,No Gallops,Rubs or new Murmurs,  +ve B.Sounds, Abd Soft, No tenderness,   No Cyanosis, Clubbing or edema  Data Review:    Recent Labs  Lab 07/04/23 2343 07/05/23 1440 07/06/23 0525  WBC 7.7 7.1 6.1  HGB 13.0 11.6* 11.9*  HCT 39.3 34.9* 35.9*  PLT 240 229 227  MCV 84.7 83.9 84.3  MCH 28.0 27.9 27.9  MCHC 33.1 33.2 33.1  RDW 13.5 13.4 13.2  LYMPHSABS 1.5  --   --   MONOABS 0.8  --   --   EOSABS 0.1  --   --   BASOSABS 0.0  --   --     Recent Labs  Lab 07/04/23 2343 07/05/23 1440 07/06/23 0525  NA 131* 135 134*  K 3.7 3.6 4.2  CL 97* 99 100  CO2 23 24 24   ANIONGAP 11 12 10   GLUCOSE 147* 126* 125*  BUN 15 12 13   CREATININE 0.78  0.86 0.80  DDIMER 2.71*  --   --   CALCIUM 9.1 9.0 9.0      Recent Labs  Lab 07/04/23 2343 07/05/23 1440 07/06/23 0525  DDIMER 2.71*  --   --   CALCIUM 9.1 9.0 9.0      Micro Results No results found for this or any previous visit (from the past 240 hours).  Radiology Report ECHOCARDIOGRAM COMPLETE Result Date: 07/05/2023    ECHOCARDIOGRAM REPORT   Patient Name:   MACINTYRE ALEXA Date of Exam: 07/05/2023 Medical Rec #:  [id number]   Height:       70.0 in Accession #:    [id number]  Weight:       210.0 lb Date of Birth:  10-Feb-1951  BSA:          2.131 m Patient Age:    72 years    BP:           160/69 mmHg Patient Gender: M           HR:           68 bpm. Exam Location:  Inpatient Procedure: 2D Echo, 3D Echo, Cardiac Doppler, Color Doppler, Strain Analysis and            Intracardiac Opacification Agent (Both Spectral and Color Flow            Doppler were utilized during procedure). Indications:    Pulmonary Embolus  History:        Patient has no prior history of Echocardiogram examinations.                 Risk Factors:Diabetes.  Sonographer:    Karma Ganja Referring Phys: Cecille Po MELVIN  Sonographer Comments: Global longitudinal strain was attempted. IMPRESSIONS  1. Left ventricular ejection fraction, by estimation, is 45 to 50%. Left ventricular ejection fraction by 3D volume is 40 % though this is mildly underestimated.. Left ventricular ejection fraction by 2D MOD biplane is 46.8 %. The left ventricle has mildly decreased function. The left ventricle has global hypokinesis. Left ventricular diastolic parameters are consistent with Grade I diastolic dysfunction (impaired relaxation). The average left ventricular global longitudinal strain is -10.6 %. The global longitudinal strain is abnormal.  2. Right ventricular systolic function is normal. The right ventricular size is mildly enlarged.  3. Right atrial size was mildly dilated.  4. The mitral valve is normal in structure. No evidence of  mitral valve regurgitation. No evidence of mitral stenosis.  5. The aortic valve is normal in structure. Aortic valve regurgitation is mild to moderate. No aortic stenosis is present.  6. The inferior vena cava is normal  in size with greater than 50% respiratory variability, suggesting right atrial pressure of 3 mmHg. FINDINGS  Left Ventricle: Left ventricular ejection fraction, by estimation, is 45 to 50%. Left ventricular ejection fraction by 2D MOD biplane is 46.8 %. Left ventricular ejection fraction by 3D volume is 40 %. The left ventricle has mildly decreased function. The left ventricle demonstrates global hypokinesis. Definity contrast agent was given IV to delineate the left ventricular endocardial borders. The average left ventricular global longitudinal strain is -10.6 %. Strain was performed and the global longitudinal strain is abnormal. The left ventricular internal cavity size was normal in size. There is no left ventricular hypertrophy. Left ventricular diastolic parameters are consistent with Grade I diastolic dysfunction (impaired relaxation). Right Ventricle: The right ventricular size is mildly enlarged. No increase in right ventricular wall thickness. Right ventricular systolic function is normal. Left Atrium: Left atrial size was normal in size. Right Atrium: Right atrial size was mildly dilated. Pericardium: There is no evidence of pericardial effusion. Mitral Valve: The mitral valve is normal in structure. No evidence of mitral valve regurgitation. No evidence of mitral valve stenosis. Tricuspid Valve: The tricuspid valve is normal in structure. Tricuspid valve regurgitation is trivial. No evidence of tricuspid stenosis. Aortic Valve: The aortic valve is normal in structure. Aortic valve regurgitation is mild to moderate. Aortic regurgitation PHT measures 481 msec. No aortic stenosis is present. Aortic valve mean gradient measures 4.0 mmHg. Aortic valve peak gradient measures 8.6 mmHg. Aortic  valve area, by VTI measures 2.59 cm. Pulmonic Valve: The pulmonic valve was normal in structure. Pulmonic valve regurgitation is not visualized. No evidence of pulmonic stenosis. Aorta: The aortic root is normal in size and structure. Venous: The inferior vena cava is normal in size with greater than 50% respiratory variability, suggesting right atrial pressure of 3 mmHg. IAS/Shunts: No atrial level shunt detected by color flow Doppler. Additional Comments: 3D was performed not requiring image post processing on an independent workstation and was abnormal.  LEFT VENTRICLE PLAX 2D                        Biplane EF (MOD) LVIDd:         5.50 cm         LV Biplane EF:   Left LVIDs:         4.40 cm                          ventricular LV PW:         1.00 cm                          ejection LV IVS:        1.40 cm                          fraction by LVOT diam:     2.20 cm                          2D MOD LV SV:         76                               biplane is LV SV Index:   36  46.8 %. LVOT Area:     3.80 cm                                Diastology                                LV e' medial:    4.68 cm/s LV Volumes (MOD)               LV E/e' medial:  5.8 LV vol d, MOD    219.0 ml      LV e' lateral:   6.64 cm/s A2C:                           LV E/e' lateral: 4.1 LV vol d, MOD    185.0 ml A4C:                           2D LV vol s, MOD    109.0 ml      Longitudinal A2C:                           Strain LV vol s, MOD    102.0 ml      2D Strain GLS  -12.1 % A4C:                           (A2C): LV SV MOD A2C:   110.0 ml      2D Strain GLS  -10.7 % LV SV MOD A4C:   185.0 ml      (A3C): LV SV MOD BP:    94.7 ml       2D Strain GLS  -9.1 %                                (A4C):                                2D Strain GLS  -10.6 %                                Avg:                                 3D Volume EF                                LV 3D EF:    Left                                              ventricul  ar                                             ejection                                             fraction                                             by 3D                                             volume is                                             40 %.                                 3D Volume EF:                                3D EF:        40 %                                LV EDV:       228 ml                                LV ESV:       137 ml                                LV SV:        91 ml RIGHT VENTRICLE RV Basal diam:  4.60 cm RV S prime:     12.70 cm/s TAPSE (M-mode): 2.6 cm LEFT ATRIUM             Index        RIGHT ATRIUM           Index LA diam:        4.40 cm 2.06 cm/m   RA Area:     19.90 cm LA Vol (A2C):   67.1 ml 31.49 ml/m  RA Volume:   59.90 ml  28.11 ml/m LA Vol (A4C):   37.2 ml 17.46 ml/m LA Biplane Vol: 50.2 ml 23.56 ml/m  AORTIC VALVE AV Area (Vmax):    2.79 cm AV Area (Vmean):   2.71 cm AV Area (VTI):     2.59 cm AV Vmax:           147.00 cm/s AV Vmean:          95.500 cm/s AV VTI:  0.294 m AV Peak Grad:      8.6 mmHg AV Mean Grad:      4.0 mmHg LVOT Vmax:         108.00 cm/s LVOT Vmean:        68.100 cm/s LVOT VTI:          0.200 m LVOT/AV VTI ratio: 0.68 AI PHT:            481 msec  AORTA Ao Root diam: 3.40 cm MITRAL VALVE MV Area (PHT): 4.17 cm    SHUNTS MV Decel Time: 182 msec    Systemic VTI:  0.20 m MV E velocity: 27.30 cm/s  Systemic Diam: 2.20 cm MV A velocity: 82.30 cm/s MV E/A ratio:  0.33 Clearnce Hasten Electronically signed by Clearnce Hasten Signature Date/Time: 07/05/2023/3:42:53 PM    Final    CT Angio Chest PE W and/or Wo Contrast Result Date: 07/05/2023 CLINICAL DATA:  Shortness of breath EXAM: CT ANGIOGRAPHY CHEST WITH CONTRAST TECHNIQUE: Multidetector CT imaging of the chest was performed using the standard protocol during bolus administration of intravenous contrast.  Multiplanar CT image reconstructions and MIPs were obtained to evaluate the vascular anatomy. RADIATION DOSE REDUCTION: This exam was performed according to the departmental dose-optimization program which includes automated exposure control, adjustment of the mA and/or kV according to patient size and/or use of iterative reconstruction technique. CONTRAST:  75mL OMNIPAQUE IOHEXOL 350 MG/ML SOLN COMPARISON:  None Available. FINDINGS: Cardiovascular: Heart is normal size. Aorta is normal caliber. Partially occlusive small pulmonary embolus noted in the right lower lobe. No pulmonary emboli on the left. Scattered aortic calcifications. Mediastinum/Nodes: No mediastinal, hilar, or axillary adenopathy. Trachea and esophagus are unremarkable. Thyroid unremarkable. Lungs/Pleura: Trace right pleural effusion. Right base atelectasis. Left lung clear. Upper Abdomen: No acute findings Musculoskeletal: Mild bilateral gynecomastia. No acute bony abnormality. Review of the MIP images confirms the above findings. IMPRESSION: Small filling defect in the right lower lobe compatible with small nonocclusive pulmonary embolus. Trace right pleural effusion, right base atelectasis. Aortic Atherosclerosis (ICD10-I70.0). Electronically Signed   By: Charlett Nose M.D.   On: 07/05/2023 03:50   DG Chest 2 View Result Date: 07/05/2023 CLINICAL DATA:  Right lower back pain EXAM: CHEST - 2 VIEW COMPARISON:  None Available. FINDINGS: The heart size and mediastinal contours are within normal limits. Both lungs are clear. The visualized skeletal structures are unremarkable. IMPRESSION: No active cardiopulmonary disease. Electronically Signed   By: Charlett Nose M.D.   On: 07/05/2023 00:25     Signature  -   Susa Raring M.D on 07/06/2023 at 9:22 AM   -  To page go to [email or web address]

## 2023-07-07 ENCOUNTER — Other Ambulatory Visit (HOSPITAL_COMMUNITY): Payer: Self-pay

## 2023-07-07 DIAGNOSIS — I2699 Other pulmonary embolism without acute cor pulmonale: Secondary | ICD-10-CM | POA: Diagnosis not present

## 2023-07-07 LAB — CBC
HCT: 34.5 % — ABNORMAL LOW (ref 39.0–52.0)
Hemoglobin: 11.7 g/dL — ABNORMAL LOW (ref 13.0–17.0)
MCH: 28.7 pg (ref 26.0–34.0)
MCHC: 33.9 g/dL (ref 30.0–36.0)
MCV: 84.6 fL (ref 80.0–100.0)
Platelets: 229 10*3/uL (ref 150–400)
RBC: 4.08 MIL/uL — ABNORMAL LOW (ref 4.22–5.81)
RDW: 13.3 % (ref 11.5–15.5)
WBC: 5.6 10*3/uL (ref 4.0–10.5)
nRBC: 0 % (ref 0.0–0.2)

## 2023-07-07 LAB — GLUCOSE, CAPILLARY: Glucose-Capillary: 136 mg/dL — ABNORMAL HIGH (ref 70–99)

## 2023-07-07 LAB — HEPARIN LEVEL (UNFRACTIONATED): Heparin Unfractionated: >1.1 [IU]/mL — ABNORMAL HIGH (ref 0.30–0.70)

## 2023-07-07 MED ORDER — CARVEDILOL 3.125 MG PO TABS
3.1250 mg | ORAL_TABLET | Freq: Two times a day (BID) | ORAL | 0 refills | Status: DC
  Filled 2023-07-07: qty 60, 30d supply, fill #0

## 2023-07-07 MED ORDER — APIXABAN 5 MG PO TABS
ORAL_TABLET | ORAL | 0 refills | Status: DC
  Filled 2023-07-07: qty 60, 24d supply, fill #0
  Filled 2023-07-07: qty 60, 30d supply, fill #0

## 2023-07-07 NOTE — TOC Transition Note (Signed)
 Transition of Care [facility]) - Discharge Note   Patient Details  Name: Frederick Harris MRN: [id number] Date of Birth: 1951-03-01  [facility] CM/SW Contact:  Gordy Clement, RN Phone Number: 07/07/2023, 9:06 AM   Clinical Narrative:    Patient to DC to home today. No TOC needs have been identified.  Patient will follow up as directed on AVS. Wife to transport            Patient Goals and CMS Choice            Discharge Placement                       Discharge Plan and Services Additional resources added to the After Visit Summary for                                       Social Drivers of Health (SDOH) Interventions SDOH Screenings   Food Insecurity: No Food Insecurity (07/05/2023)  Housing: Low Risk  (07/05/2023)  Transportation Needs: No Transportation Needs (07/05/2023)  Utilities: Not At Risk (07/05/2023)  Social Connections: Unknown (07/05/2023)  Tobacco Use: Low Risk  (07/04/2023)     Readmission Risk Interventions     No data to display

## 2023-07-07 NOTE — Discharge Summary (Signed)
 Frederick Harris IRJ:[id number] DOB: 1950-07-02 DOA: 07/05/2023  PCP: Knox Royalty, MD  Admit date: 07/05/2023  Discharge date: 07/07/2023  Admitted From: Home   Disposition:  Home   Recommendations for Outpatient Follow-up:   Follow up with PCP in 1-2 weeks  PCP Please obtain BMP/CBC, 2 view CXR in 1week,  (see Discharge instructions)   PCP Please follow up on the following pending results: Monitor CBGs closely needs outpatient cardiology follow-up along with one-time follow-up with hematology.   Home Health: None   Equipment/Devices: None  Consultations: None  Discharge Condition: Stable    CODE STATUS: Full    Diet Recommendation: Heart Healthy     Chief Complaint  Patient presents with   Back Pain     Brief history of present illness from the day of admission and additional interim summary    73 y.o. male with medical history significant of hypertension, diabetes presenting with back pain.  Patient presented with 1 day of right-sided mid back pain.  Noted to have episodes of pleuritic pain as well.  No significant pain on presentation to the ED. workup consistent of small right lower lobe PE.  He was admitted for further care.                                                                 Hospital Course   Acute right lower lobe subsegmental PE along with acute left lower extremity DVT > Patient presented primarily with back pain and pleuritic pain, CTA noted, echo shows stable RV function no signs of RV dysfunction, lower extremity venous duplex pending, on heparin drip with transition to Eliquis, currently symptom-free and eager to go home.  He has no identifiable inciting factors except for his age, will have him follow-up with hematology outpatient postdischarge for any secondary hypercoagulable state  workup.   Hypertension - Continue home losartan, hydrochlorothiazide added low-dose Coreg as he now has new diagnosis of chronic systolic CHF.   Chronic systolic CHF.  Currently compensated EF around 40%.  Continue home ARB and diuretic, add Coreg, already now on Eliquis, will have him follow-up with cardiology outpatient for ischemic workup if needed.  Cussed with patient prefers outpatient workup.   DM type II.  Diet controlled metformin resume upon discharge.  Follow-up with PCP for further outpatient glycemic control.  Lab Results  Component Value Date   HGBA1C 6.8 (H) 07/06/2023    Discharge diagnosis     Principal Problem:   Acute pulmonary embolism (HCC) Active Problems:   Diabetes mellitus without complication (HCC)   HTN (hypertension)    Discharge instructions    Discharge Instructions     Diet - low sodium heart healthy   Complete by: As directed    Discharge instructions   Complete by: As directed  Follow with Primary MD Knox Royalty, MD in 7 days   Get CBC, CMP, 2 view Chest X ray -  checked next visit with your primary MD   Activity: As tolerated with Full fall precautions use walker/cane & assistance as needed  Disposition Home    Diet: Heart Healthy    Special Instructions: If you have smoked or chewed Tobacco  in the last 2 yrs please stop smoking, stop any regular Alcohol  and or any Recreational drug use.  On your next visit with your primary care physician please Get Medicines reviewed and adjusted.  Please request your Prim.MD to go over all Hospital Tests and Procedure/Radiological results at the follow up, please get all Hospital records sent to your Prim MD by signing hospital release before you go home.  If you experience worsening of your admission symptoms, develop shortness of breath, life threatening emergency, suicidal or homicidal thoughts you must seek medical attention immediately by calling 911 or calling your MD immediately  if  symptoms less severe.  You Must read complete instructions/literature along with all the possible adverse reactions/side effects for all the Medicines you take and that have been prescribed to you. Take any new Medicines after you have completely understood and accpet all the possible adverse reactions/side effects.   Do not drive when taking Pain medications.  Do not take more than prescribed Pain, Sleep and Anxiety Medications   Increase activity slowly   Complete by: As directed        Discharge Medications   Allergies as of 07/07/2023       Reactions   Iodine Other (See Comments)   Shellfish Allergy Hives, Itching, Swelling        Medication List     TAKE these medications    apixaban 5 MG Tabs tablet Commonly known as: ELIQUIS Take 2 tablets (10 mg total) by mouth 2 (two) times daily for 6 days, THEN 1 tablet (5 mg total) 2 (two) times daily. Start taking on: July 07, 2023   BLACK CURRANT SEED OIL PO Take 1 capsule by mouth daily.   carvedilol 3.125 MG tablet Commonly known as: COREG Take 1 tablet (3.125 mg total) by mouth 2 (two) times daily with a meal.   COQ10 PO Take 1 tablet by mouth daily.   losartan-hydrochlorothiazide 100-25 MG tablet Commonly known as: HYZAAR Take 1 tablet by mouth at bedtime.   MAGNESIUM PO Take 1 tablet by mouth daily.   metFORMIN 500 MG 24 hr tablet Commonly known as: GLUCOPHAGE-XR Take 500 mg by mouth daily.   polyvinyl alcohol 1.4 % ophthalmic solution Commonly known as: LIQUIFILM TEARS Place 1 drop into both eyes as needed for dry eyes.   VITAMIN B-12 PO Take 1 tablet by mouth daily.   Vitamin D3 Liqd Take 5 drops by mouth daily.         Follow-up Information     Knox Royalty, MD. Schedule an appointment as soon as possible for a visit in 1 week(s).   Specialty: Family Medicine Why: Follow-up with a hematologist and a cardiologist within 1 to 2 weeks of discharge.  Get a referral from your primary care  physician. Contact information: [address] [place] [address] [phone]         Jake Bathe, MD. Schedule an appointment as soon as possible for a visit in 1 week(s).   Specialty: Cardiology Contact information: [address] [place] [address] [phone]  Artis Delay, MD. Schedule an appointment as soon as possible for a visit in 2 day(s).   Specialty: Hematology and Oncology Contact information: [address] [place] [address] [phone]                 Major procedures and Radiology Reports - PLEASE review detailed and final reports thoroughly  -      VAS [place] LOWER EXTREMITY VENOUS (DVT) Result Date: 07/07/2023  Lower Venous DVT Study Patient Name:  Frederick Harris  Date of Exam:   07/06/2023 Medical Rec #: [id number]    Accession #:    [id number] Date of Birth: 20-Oct-1950   Patient Gender: M Patient Age:   13 years Exam Location:  [facility] Procedure:      VAS [place] LOWER EXTREMITY VENOUS (DVT) Referring Phys: Lyn Hollingshead MELVIN --------------------------------------------------------------------------------  Indications: Pulmonary embolism.  Comparison Study: No prior study on file Performing Technologist: Sherren Kerns RVS  Examination Guidelines: A complete evaluation includes B-mode imaging, spectral Doppler, color Doppler, and power Doppler as needed of all accessible portions of each vessel. Bilateral testing is considered an integral part of a complete examination. Limited examinations for reoccurring indications may be performed as noted. The reflux portion of the exam is performed with the patient in reverse Trendelenburg.  +---------+---------------+---------+-----------+----------+--------------+ RIGHT    CompressibilityPhasicitySpontaneityPropertiesThrombus Aging +---------+---------------+---------+-----------+----------+--------------+ CFV      Full           Yes      Yes                                  +---------+---------------+---------+-----------+----------+--------------+ SFJ      Full                                                        +---------+---------------+---------+-----------+----------+--------------+ FV Prox  Full                                                        +---------+---------------+---------+-----------+----------+--------------+ FV Mid   Full                                                        +---------+---------------+---------+-----------+----------+--------------+ FV DistalFull                                                        +---------+---------------+---------+-----------+----------+--------------+ PFV      Full                                                        +---------+---------------+---------+-----------+----------+--------------+ POP  Full           Yes      Yes                                 +---------+---------------+---------+-----------+----------+--------------+ PTV      Full                                                        +---------+---------------+---------+-----------+----------+--------------+ PERO     Full                                                        +---------+---------------+---------+-----------+----------+--------------+ Gastroc  Full                                                        +---------+---------------+---------+-----------+----------+--------------+   +---------+---------------+---------+-----------+----------+-----------------+ LEFT     CompressibilityPhasicitySpontaneityPropertiesThrombus Aging    +---------+---------------+---------+-----------+----------+-----------------+ CFV      Full                                                           +---------+---------------+---------+-----------+----------+-----------------+ SFJ      Full                                                            +---------+---------------+---------+-----------+----------+-----------------+ FV Prox  Full                                                           +---------+---------------+---------+-----------+----------+-----------------+ FV Mid   Partial        Yes      Yes                  Acute             +---------+---------------+---------+-----------+----------+-----------------+ FV DistalNone           No       No                   Acute             +---------+---------------+---------+-----------+----------+-----------------+ PFV      Full                                                           +---------+---------------+---------+-----------+----------+-----------------+  POP      Full           Yes      Yes                                    +---------+---------------+---------+-----------+----------+-----------------+ PTV      Partial                                      Age Indeterminate +---------+---------------+---------+-----------+----------+-----------------+ PERO     Full                                                           +---------+---------------+---------+-----------+----------+-----------------+ SSV      None           No       No                   Age Indeterminate +---------+---------------+---------+-----------+----------+-----------------+     Summary: RIGHT: - There is no evidence of deep vein thrombosis in the lower extremity.  - No cystic structure found in the popliteal fossa.  LEFT: - Findings consistent with acute deep vein thrombosis involving the left femoral vein.  - Findings consistent with age indeterminate deep vein thrombosis involving the left posterior tibial veins. - Findings consistent with age indeterminate superficial vein thrombosis involving the left small saphenous vein.  - No cystic structure found in the popliteal fossa.  *See table(s) above for measurements and observations. Electronically signed by Carolynn Sayers on 07/07/2023 at 7:20:07 AM.    Final    ECHOCARDIOGRAM COMPLETE Result Date: 07/05/2023    ECHOCARDIOGRAM REPORT   Patient Name:   Frederick Harris Date of Exam: 07/05/2023 Medical Rec #:  [id number]   Height:       70.0 in Accession #:    [id number]  Weight:       210.0 lb Date of Birth:  June 10, 1950  BSA:          2.131 m Patient Age:    72 years    BP:           160/69 mmHg Patient Gender: M           HR:           68 bpm. Exam Location:  Inpatient Procedure: 2D Echo, 3D Echo, Cardiac Doppler, Color Doppler, Strain Analysis and            Intracardiac Opacification Agent (Both Spectral and Color Flow            Doppler were utilized during procedure). Indications:    Pulmonary Embolus  History:        Patient has no prior history of Echocardiogram examinations.                 Risk Factors:Diabetes.  Sonographer:    Karma Ganja Referring Phys: Cecille Po MELVIN  Sonographer Comments: Global longitudinal strain was attempted. IMPRESSIONS  1. Left ventricular ejection fraction, by estimation, is 45 to 50%. Left ventricular ejection fraction by 3D volume is 40 % though this is mildly underestimated.. Left ventricular ejection fraction by 2D MOD biplane is  46.8 %. The left ventricle has mildly decreased function. The left ventricle has global hypokinesis. Left ventricular diastolic parameters are consistent with Grade I diastolic dysfunction (impaired relaxation). The average left ventricular global longitudinal strain is -10.6 %. The global longitudinal strain is abnormal.  2. Right ventricular systolic function is normal. The right ventricular size is mildly enlarged.  3. Right atrial size was mildly dilated.  4. The mitral valve is normal in structure. No evidence of mitral valve regurgitation. No evidence of mitral stenosis.  5. The aortic valve is normal in structure. Aortic valve regurgitation is mild to moderate. No aortic stenosis is present.  6. The inferior vena cava is normal in size with greater than  50% respiratory variability, suggesting right atrial pressure of 3 mmHg. FINDINGS  Left Ventricle: Left ventricular ejection fraction, by estimation, is 45 to 50%. Left ventricular ejection fraction by 2D MOD biplane is 46.8 %. Left ventricular ejection fraction by 3D volume is 40 %. The left ventricle has mildly decreased function. The left ventricle demonstrates global hypokinesis. Definity contrast agent was given IV to delineate the left ventricular endocardial borders. The average left ventricular global longitudinal strain is -10.6 %. Strain was performed and the global longitudinal strain is abnormal. The left ventricular internal cavity size was normal in size. There is no left ventricular hypertrophy. Left ventricular diastolic parameters are consistent with Grade I diastolic dysfunction (impaired relaxation). Right Ventricle: The right ventricular size is mildly enlarged. No increase in right ventricular wall thickness. Right ventricular systolic function is normal. Left Atrium: Left atrial size was normal in size. Right Atrium: Right atrial size was mildly dilated. Pericardium: There is no evidence of pericardial effusion. Mitral Valve: The mitral valve is normal in structure. No evidence of mitral valve regurgitation. No evidence of mitral valve stenosis. Tricuspid Valve: The tricuspid valve is normal in structure. Tricuspid valve regurgitation is trivial. No evidence of tricuspid stenosis. Aortic Valve: The aortic valve is normal in structure. Aortic valve regurgitation is mild to moderate. Aortic regurgitation PHT measures 481 msec. No aortic stenosis is present. Aortic valve mean gradient measures 4.0 mmHg. Aortic valve peak gradient measures 8.6 mmHg. Aortic valve area, by VTI measures 2.59 cm. Pulmonic Valve: The pulmonic valve was normal in structure. Pulmonic valve regurgitation is not visualized. No evidence of pulmonic stenosis. Aorta: The aortic root is normal in size and structure. Venous:  The inferior vena cava is normal in size with greater than 50% respiratory variability, suggesting right atrial pressure of 3 mmHg. IAS/Shunts: No atrial level shunt detected by color flow Doppler. Additional Comments: 3D was performed not requiring image post processing on an independent workstation and was abnormal.  LEFT VENTRICLE PLAX 2D                        Biplane EF (MOD) LVIDd:         5.50 cm         LV Biplane EF:   Left LVIDs:         4.40 cm                          ventricular LV PW:         1.00 cm                          ejection LV IVS:        1.40 cm  fraction by LVOT diam:     2.20 cm                          2D MOD LV SV:         76                               biplane is LV SV Index:   36                               46.8 %. LVOT Area:     3.80 cm                                Diastology                                LV e' medial:    4.68 cm/s LV Volumes (MOD)               LV E/e' medial:  5.8 LV vol d, MOD    219.0 ml      LV e' lateral:   6.64 cm/s A2C:                           LV E/e' lateral: 4.1 LV vol d, MOD    185.0 ml A4C:                           2D LV vol s, MOD    109.0 ml      Longitudinal A2C:                           Strain LV vol s, MOD    102.0 ml      2D Strain GLS  -12.1 % A4C:                           (A2C): LV SV MOD A2C:   110.0 ml      2D Strain GLS  -10.7 % LV SV MOD A4C:   185.0 ml      (A3C): LV SV MOD BP:    94.7 ml       2D Strain GLS  -9.1 %                                (A4C):                                2D Strain GLS  -10.6 %                                Avg:                                 3D Volume EF  LV 3D EF:    Left                                             ventricul                                             ar                                             ejection                                             fraction                                             by 3D                                              volume is                                             40 %.                                 3D Volume EF:                                3D EF:        40 %                                LV EDV:       228 ml                                LV ESV:       137 ml                                LV SV:        91 ml RIGHT VENTRICLE RV Basal diam:  4.60 cm RV S prime:     12.70 cm/s TAPSE (M-mode): 2.6 cm LEFT ATRIUM             Index        RIGHT ATRIUM           Index LA diam:        4.40 cm 2.06 cm/m   RA Area:  19.90 cm LA Vol (A2C):   67.1 ml 31.49 ml/m  RA Volume:   59.90 ml  28.11 ml/m LA Vol (A4C):   37.2 ml 17.46 ml/m LA Biplane Vol: 50.2 ml 23.56 ml/m  AORTIC VALVE AV Area (Vmax):    2.79 cm AV Area (Vmean):   2.71 cm AV Area (VTI):     2.59 cm AV Vmax:           147.00 cm/s AV Vmean:          95.500 cm/s AV VTI:            0.294 m AV Peak Grad:      8.6 mmHg AV Mean Grad:      4.0 mmHg LVOT Vmax:         108.00 cm/s LVOT Vmean:        68.100 cm/s LVOT VTI:          0.200 m LVOT/AV VTI ratio: 0.68 AI PHT:            481 msec  AORTA Ao Root diam: 3.40 cm MITRAL VALVE MV Area (PHT): 4.17 cm    SHUNTS MV Decel Time: 182 msec    Systemic VTI:  0.20 m MV E velocity: 27.30 cm/s  Systemic Diam: 2.20 cm MV A velocity: 82.30 cm/s MV E/A ratio:  0.33 Clearnce Hasten Electronically signed by Clearnce Hasten Signature Date/Time: 07/05/2023/3:42:53 PM    Final    CT Angio Chest PE W and/or Wo Contrast Result Date: 07/05/2023 CLINICAL DATA:  Shortness of breath EXAM: CT ANGIOGRAPHY CHEST WITH CONTRAST TECHNIQUE: Multidetector CT imaging of the chest was performed using the standard protocol during bolus administration of intravenous contrast. Multiplanar CT image reconstructions and MIPs were obtained to evaluate the vascular anatomy. RADIATION DOSE REDUCTION: This exam was performed according to the departmental dose-optimization program which includes automated exposure control, adjustment  of the mA and/or kV according to patient size and/or use of iterative reconstruction technique. CONTRAST:  75mL OMNIPAQUE IOHEXOL 350 MG/ML SOLN COMPARISON:  None Available. FINDINGS: Cardiovascular: Heart is normal size. Aorta is normal caliber. Partially occlusive small pulmonary embolus noted in the right lower lobe. No pulmonary emboli on the left. Scattered aortic calcifications. Mediastinum/Nodes: No mediastinal, hilar, or axillary adenopathy. Trachea and esophagus are unremarkable. Thyroid unremarkable. Lungs/Pleura: Trace right pleural effusion. Right base atelectasis. Left lung clear. Upper Abdomen: No acute findings Musculoskeletal: Mild bilateral gynecomastia. No acute bony abnormality. Review of the MIP images confirms the above findings. IMPRESSION: Small filling defect in the right lower lobe compatible with small nonocclusive pulmonary embolus. Trace right pleural effusion, right base atelectasis. Aortic Atherosclerosis (ICD10-I70.0). Electronically Signed   By: Charlett Nose M.D.   On: 07/05/2023 03:50   DG Chest 2 View Result Date: 07/05/2023 CLINICAL DATA:  Right lower back pain EXAM: CHEST - 2 VIEW COMPARISON:  None Available. FINDINGS: The heart size and mediastinal contours are within normal limits. Both lungs are clear. The visualized skeletal structures are unremarkable. IMPRESSION: No active cardiopulmonary disease. Electronically Signed   By: Charlett Nose M.D.   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  Moderate to moderately severe arthritic changes left hip.  Bilateral hips well located.  No acute fractures.  No evidence of AVN.   Micro Results    No results found for this or any previous  visit (from the past 240 hours).  Today   Subjective    Jimie Kuwahara today has no headache,no chest abdominal pain,no new weakness tingling  or numbness, feels much better wants to go home today.    Objective   Blood pressure (!) 119/55, pulse 67, temperature 98.1 F (36.7 C), temperature source Oral, resp. rate 18, height 5\' 10"  (1.778 m), weight 95.3 kg, SpO2 94%.   Intake/Output Summary (Last 24 hours) at 07/07/2023 0733 Last data filed at 07/06/2023 1600 Gross per 24 hour  Intake 3 ml  Output --  Net 3 ml    Exam  Awake Alert, No new F.N deficits,    Wilson.AT,PERRAL Supple Neck,   Symmetrical Chest wall movement, Good air movement bilaterally, CTAB RRR,No Gallops,   +ve B.Sounds, Abd Soft, Non tender,  No Cyanosis, Clubbing or edema    Data Review   Recent Labs  Lab 07/04/23 2343 07/05/23 1440 07/06/23 0525 07/07/23 0533  WBC 7.7 7.1 6.1 5.6  HGB 13.0 11.6* 11.9* 11.7*  HCT 39.3 34.9* 35.9* 34.5*  PLT 240 229 227 229  MCV 84.7 83.9 84.3 84.6  MCH 28.0 27.9 27.9 28.7  MCHC 33.1 33.2 33.1 33.9  RDW 13.5 13.4 13.2 13.3  LYMPHSABS 1.5  --   --   --   MONOABS 0.8  --   --   --   EOSABS 0.1  --   --   --   BASOSABS 0.0  --   --   --     Recent Labs  Lab 07/04/23 2343 07/05/23 1440 07/06/23 0525  NA 131* 135 134*  K 3.7 3.6 4.2  CL 97* 99 100  CO2 23 24 24   ANIONGAP 11 12 10   GLUCOSE 147* 126* 125*  BUN 15 12 13   CREATININE 0.78 0.86 0.80  DDIMER 2.71*  --   --   HGBA1C  --   --  6.8*  CALCIUM 9.1 9.0 9.0    Total Time in preparing paper work, data evaluation and todays exam - 35 minutes  Signature  -    Susa Raring M.D on 07/07/2023 at 7:33 AM   -  To page go to [email or web address]

## 2023-07-26 ENCOUNTER — Other Ambulatory Visit: Payer: Self-pay

## 2023-07-27 ENCOUNTER — Telehealth: Payer: Self-pay

## 2023-07-27 ENCOUNTER — Other Ambulatory Visit: Payer: Self-pay

## 2023-07-27 ENCOUNTER — Other Ambulatory Visit (HOSPITAL_COMMUNITY): Payer: Self-pay

## 2023-07-27 NOTE — Telephone Encounter (Signed)
 Returned his call and scheduled appt with Dr. Bertis Ruddy on 3/25 at 0920, instructed to arrive at 9 am and he is aware of address.

## 2023-07-27 NOTE — Progress Notes (Unsigned)
 Cardiology Office Note:    Date:  07/28/2023   ID:  QUASIM DOYON, DOB [birth year], MRN [id number]  PCP:  Knox Royalty, MD  Cardiologist:  Little Ishikawa, MD  Electrophysiologist:  None   Referring MD: Knox Royalty, MD   Chief Complaint  Patient presents with   Congestive Heart Failure    History of Present Illness:    Frederick Harris is a 73 y.o. male with a hx of PE/DVT, hypertension, hyperlipidemia who is referred by Dr. Yetta Barre for evaluation of systolic heart failure.  He was admitted on 07/05/2023, found to have acute right lower lobe subsegmental PE along with acute left lower extremity DVT.  He was started on Eliquis.  Echocardiogram 07/05/2023 showed EF 45 to 50%, normal RV function, mild RV enlargement, mild to moderate aortic regurgitation.  Since discharge from hospital, he reports he is doing well.  Denies any chest pain, dyspnea, lightheadedness, syncope, lower extremity edema, or palpitations.  Denies any bleeding on Eliquis.   Past Medical History:  Diagnosis Date   Diabetes mellitus without complication (HCC)    Hypertension     No past surgical history on file.  Current Medications: Current Meds  Medication Sig   apixaban (ELIQUIS) 5 MG TABS tablet Take 1 tablet (5 mg total) by mouth 2 (two) times daily.   BLACK CURRANT SEED OIL PO Take 1 capsule by mouth daily.   Cholecalciferol (VITAMIN D3) LIQD Take 5 drops by mouth daily.   Coenzyme Q10 (COQ10 PO) Take 1 tablet by mouth daily.   Cyanocobalamin (VITAMIN B-12 PO) Take 1 tablet by mouth daily.   losartan-hydrochlorothiazide (HYZAAR) 100-25 MG tablet Take 1 tablet by mouth at bedtime.   MAGNESIUM PO Take 1 tablet by mouth daily.   metFORMIN (GLUCOPHAGE-XR) 500 MG 24 hr tablet Take 500 mg by mouth daily.   polyvinyl alcohol (LIQUIFILM TEARS) 1.4 % ophthalmic solution Place 1 drop into both eyes as needed for dry eyes.   [DISCONTINUED] carvedilol (COREG) 3.125 MG tablet Take 1 tablet (3.125 mg total) by  mouth 2 (two) times daily with a meal.     Allergies:   Iodine and Shellfish allergy   Social History   Socioeconomic History   Marital status: Married    Spouse name: Not on file   Number of children: Not on file   Years of education: Not on file   Highest education level: Not on file  Occupational History   Not on file  Tobacco Use   Smoking status: Never   Smokeless tobacco: Never  Vaping Use   Vaping status: Never Used  Substance and Sexual Activity   Alcohol use: Never   Drug use: Never   Sexual activity: Yes    Partners: Female    Comment: married  Other Topics Concern   Not on file  Social History Narrative   Not on file   Social Drivers of Health   Financial Resource Strain: Not on file  Food Insecurity: No Food Insecurity (07/05/2023)   Hunger Vital Sign    Worried About Running Out of Food in the Last Year: Never true    Ran Out of Food in the Last Year: Never true  Transportation Needs: No Transportation Needs (07/05/2023)   PRAPARE - Administrator, Civil Service (Medical): No    Lack of Transportation (Non-Medical): No  Physical Activity: Not on file  Stress: Not on file  Social Connections: Unknown (07/05/2023)   Social Connection and Isolation Panel [  NHANES]    Frequency of Communication with Friends and Family: Twice a week    Frequency of Social Gatherings with Friends and Family: Twice a week    Attends Religious Services: 1 to 4 times per year    Active Member of [employer] or Organizations: Yes    Attends Banker Meetings: 1 to 4 times per year    Marital Status: Patient unable to answer     Family History: The patient's family history is not on file.  ROS:   Please see the history of present illness.     All other systems reviewed and are negative.  EKGs/Labs/Other Studies Reviewed:    The following studies were reviewed today:   EKG:  EKG is not ordered today.    Recent Labs: 07/06/2023: BUN 13; Creatinine, Ser  0.80; Potassium 4.2; Sodium 134 07/07/2023: Hemoglobin 11.7; Platelets 229  Recent Lipid Panel No results found for: "CHOL", "TRIG", "HDL", "CHOLHDL", "VLDL", "LDLCALC", "LDLDIRECT"  Physical Exam:    VS:  BP (!) 173/78   Pulse 95   Ht 5\' 10"  (1.778 m)   Wt 207 lb 12.8 oz (94.3 kg)   SpO2 98%   BMI 29.82 kg/m     Wt Readings from Last 3 Encounters:  07/28/23 207 lb 12.8 oz (94.3 kg)  07/04/23 210 lb (95.3 kg)  11/15/21 221 lb 6.4 oz (100.4 kg)     GEN:  Well nourished, well developed in no acute distress HEENT: Normal NECK: No JVD; No carotid bruits LYMPHATICS: No lymphadenopathy CARDIAC: RRR, no murmurs, rubs, gallops RESPIRATORY:  Clear to auscultation without rales, wheezing or rhonchi  ABDOMEN: Soft, non-tender, non-distended MUSCULOSKELETAL:  No edema; No deformity  SKIN: Warm and dry NEUROLOGIC:  Alert and oriented x 3 PSYCHIATRIC:  Normal affect   ASSESSMENT:    1. Acute systolic heart failure (HCC)   2. Essential hypertension   3. Acute pulmonary embolism without acute cor pulmonale, unspecified pulmonary embolism type (HCC)    PLAN:    Acute systolic heart failure: Noted during admission for acute PE 06/2023.  Echocardiogram 07/05/2023 showed EF 45 to 50%, normal RV function, mild RV enlargement, mild to moderate aortic regurgitation. -Repeat echo prior to next clinic visit.  If EF remains reduced, will need further workup, including ischemic evaluation -Continue losartan 100 mg daily.  Increase carvedilol to 6.25 mg twice daily  Hypertension: On losartan/HCTZ 100-25 mg daily, carvedilol 3.125 mg twice daily.  Increase carvedilol as above.  Asked to check BP daily for next 2 weeks and let us know results  Acute PE: Diagnosed during admission 06/2023.  Continue Eliquis 5 mg twice daily  T2DM: On metformin.  A1c 6.8% on 07/06/2023  Hyperlipidemia: Reports was started on statin, but does not remember what he is on.  Asked him to call and let us know what he is  taking  RTC in 3 months  Medication Adjustments/Labs and Tests Ordered: Current medicines are reviewed at length with the patient today.  Concerns regarding medicines are outlined above.  Orders Placed This Encounter  Procedures   ECHOCARDIOGRAM COMPLETE   Meds ordered this encounter  Medications   carvedilol (COREG) 6.25 MG tablet    Sig: Take 1 tablet (6.25 mg total) by mouth 2 (two) times daily with a meal.    Dispense:  120 tablet    Refill:  1    Patient Instructions  Medication Instructions:  Increase: Carvedilol (Coreg) to 6.25 mg (one tablet) two times daily  *Please  call us or send a MyChart message when you find out what Statin you are taking.   Lab Work: None  Testing/Procedures: Your physician has requested that you have an echocardiogram in about 3 months, right before returning to see Dr Bjorn Pippin. Echocardiography is a painless test that uses sound waves to create images of your heart. It provides your doctor with information about the size and shape of your heart and how well your heart's chambers and valves are working. This procedure takes approximately one hour. There are no restrictions for this procedure. Please do NOT wear cologne, perfume, aftershave, or lotions (deodorant is allowed). Please arrive 15 minutes prior to your appointment time. This will take place at [address] [place]   Follow-Up: At [facility], you and your health needs are our priority.  As part of our continuing mission to provide you with exceptional heart care, we have created designated Provider Care Teams.  These Care Teams include your primary Cardiologist (physician) and Advanced Practice Providers (APPs -  Physician Assistants and Nurse Practitioners) who all work together to provide you with the care you need, when you need it.  Your next appointment:   3 month(s)  Provider:   Little Ishikawa, MD     Other Instructions Please check your Blood pressure  once per day for 2 weeks, then send [place]  your numbers thru MyChart or call [phone].    HOW TO TAKE YOUR BLOOD PRESSURE: Rest 10-15 minutes before taking your blood pressure. Don't smoke or drink caffeinated beverages for at least 30 minutes before. Take your blood pressure before (not after) you eat. Take your BP 1-2 hours after BP meds Ensure your bladder is empty. Sit comfortably with your back supported and both feet on the floor (don't cross your legs). Elevate your arm to heart level on a table or a desk. Use the proper sized cuff. It should fit smoothly and snugly around your bare upper arm. There should be enough room to slip a fingertip under the cuff. The bottom edge of the cuff should be 1 inch above the crease of the elbow.           Signed, Little Ishikawa, MD  07/28/2023 5:35 PM    [facility]

## 2023-07-28 ENCOUNTER — Ambulatory Visit: Attending: Cardiology | Admitting: Cardiology

## 2023-07-28 ENCOUNTER — Encounter: Payer: Self-pay | Admitting: Cardiology

## 2023-07-28 ENCOUNTER — Other Ambulatory Visit: Payer: Self-pay

## 2023-07-28 VITALS — BP 173/78 | HR 95 | Ht 70.0 in | Wt 207.8 lb

## 2023-07-28 DIAGNOSIS — I5021 Acute systolic (congestive) heart failure: Secondary | ICD-10-CM | POA: Diagnosis present

## 2023-07-28 DIAGNOSIS — I10 Essential (primary) hypertension: Secondary | ICD-10-CM | POA: Insufficient documentation

## 2023-07-28 DIAGNOSIS — I2699 Other pulmonary embolism without acute cor pulmonale: Secondary | ICD-10-CM | POA: Insufficient documentation

## 2023-07-28 MED ORDER — CARVEDILOL 6.25 MG PO TABS: 6.2500 mg | ORAL_TABLET | Freq: Two times a day (BID) | ORAL | 1 refills | Status: DC

## 2023-07-28 MED ORDER — ELIQUIS 5 MG PO TABS
5.0000 mg | ORAL_TABLET | Freq: Two times a day (BID) | ORAL | 0 refills | Status: DC
  Filled 2023-07-28: qty 60, 30d supply, fill #0

## 2023-07-28 NOTE — Patient Instructions (Addendum)
 Medication Instructions:  Increase: Carvedilol (Coreg) to 6.25 mg (one tablet) two times daily  *Please call us or send a MyChart message when you find out what Statin you are taking.   Lab Work: None  Testing/Procedures: Your physician has requested that you have an echocardiogram in about 3 months, right before returning to see Dr Bjorn Pippin. Echocardiography is a painless test that uses sound waves to create images of your heart. It provides your doctor with information about the size and shape of your heart and how well your heart's chambers and valves are working. This procedure takes approximately one hour. There are no restrictions for this procedure. Please do NOT wear cologne, perfume, aftershave, or lotions (deodorant is allowed). Please arrive 15 minutes prior to your appointment time. This will take place at [address] [place]   Follow-Up: At [facility], you and your health needs are our priority.  As part of our continuing mission to provide you with exceptional heart care, we have created designated Provider Care Teams.  These Care Teams include your primary Cardiologist (physician) and Advanced Practice Providers (APPs -  Physician Assistants and Nurse Practitioners) who all work together to provide you with the care you need, when you need it.  Your next appointment:   3 month(s)  Provider:   Little Ishikawa, MD     Other Instructions Please check your Blood pressure once per day for 2 weeks, then send [place]  your numbers thru MyChart or call [phone].    HOW TO TAKE YOUR BLOOD PRESSURE: Rest 10-15 minutes before taking your blood pressure. Don't smoke or drink caffeinated beverages for at least 30 minutes before. Take your blood pressure before (not after) you eat. Take your BP 1-2 hours after BP meds Ensure your bladder is empty. Sit comfortably with your back supported and both feet on the floor (don't cross your legs). Elevate your arm to  heart level on a table or a desk. Use the proper sized cuff. It should fit smoothly and snugly around your bare upper arm. There should be enough room to slip a fingertip under the cuff. The bottom edge of the cuff should be 1 inch above the crease of the elbow.

## 2023-07-31 ENCOUNTER — Other Ambulatory Visit: Payer: Self-pay

## 2023-07-31 ENCOUNTER — Ambulatory Visit (INDEPENDENT_AMBULATORY_CARE_PROVIDER_SITE_OTHER): Admitting: Sports Medicine

## 2023-07-31 ENCOUNTER — Encounter: Payer: Self-pay | Admitting: Sports Medicine

## 2023-07-31 DIAGNOSIS — M25551 Pain in right hip: Secondary | ICD-10-CM | POA: Diagnosis not present

## 2023-07-31 DIAGNOSIS — M160 Bilateral primary osteoarthritis of hip: Secondary | ICD-10-CM

## 2023-07-31 MED ORDER — LIDOCAINE HCL 1 % IJ SOLN
4.0000 mL | INTRAMUSCULAR | Status: AC | PRN
  Administered 2023-07-31: 4 mL

## 2023-07-31 MED ORDER — METHYLPREDNISOLONE ACETATE 40 MG/ML IJ SUSP
40.0000 mg | INTRAMUSCULAR | Status: AC | PRN
Start: 2023-07-31 — End: 2023-07-31
  Administered 2023-07-31: 40 mg via INTRA_ARTICULAR

## 2023-07-31 NOTE — Progress Notes (Signed)
   Patient: Frederick Harris             Date of Birth: 1950-12-19           MRN: [id number]             Visit Date: 07/31/2023  Office & Procedure Note  HPI: Nyree is a pleasant 73 year old male who presents with acute on chronic right hip pain and stiffness.  He does have known arthritis of both hips with his right hip being severe bone-on-bone.  He was planning on moving forward with right hip replacement, however he unfortunately was recently diagnosed with a blood clot and has been placed on Eliquis.  Did review hospital discharge note from 07/07/2023 with DVT in right lower lobe subsegmental PE.  Started on Eliquis 5mg  twice daily.  PE:  - Right hip: There is no redness, swelling or effusion.  There is limited range of motion in all directions but specifically with internal and external logroll.   Imaging:   *I did personally review the x-rays today during our office visit. 06/12/23: AP pelvis lateral view right hip: Severe end-stage arthritis right hip  with periarticular spurring.  Moderate to moderately severe arthritic  changes left hip.  Bilateral hips well located.  No acute fractures.  No  evidence of AVN.   Visit Diagnoses:  1. Pain in right hip   2. Bilateral primary osteoarthritis of hip    Procedures: Large Joint Inj: R hip joint on 07/31/2023 8:37 AM Indications: pain Details: 22 G 3.5 in needle, ultrasound-guided anterior approach Medications: 4 mL lidocaine 1 %; 40 mg methylPREDNISolone acetate 40 MG/ML Outcome: tolerated well, no immediate complications  Procedure: US-guided intra-articular hip injection, Right After discussion on risks/benefits/indications and informed verbal consent was obtained, a timeout was performed. Patient was lying supine on exam table. The hip was cleaned with betadine and alcohol swabs. Then utilizing ultrasound guidance, the patient's femoral head and neck junction was identified and subsequently injected with 4:1 lidocaine:depomedrol via an  in-plane approach with ultrasound visualization of the injectate administered into the hip joint. Patient tolerated procedure well without immediate complications.  Procedure, treatment alternatives, risks and benefits explained, specific risks discussed. Consent was given by the patient. Immediately prior to procedure a time out was called to verify the correct patient, procedure, equipment, support staff and site/side marked as required. Patient was prepped and draped in the usual sterile fashion.     Plan: -Discussed with Coral that he does have severe bone-on-bone osteoarthritis, unfortunately he is not able to move forward in the immediate near term with hip replacement given his recent DVT/PE on Eliquis.  He is aware that hip replacement would not be performed within 3 months of today's injection. -Through shared decision making, we did proceed with ultrasound-guided intra-articular hip injection, patient tolerated well.  Advised on 48 hours of modified rest/activity.  He may use ice/heat or Tylenol for any postinjection pain -His left hip bothers him sometimes as well but certainly not as much as the right side with stiffness.  We can always evaluate the left hip in the future if he desires -follow-up as needed  Madelyn Brunner, DO Primary Care Sports Medicine Physician  [facility]  This note was dictated using Dragon naturally speaking software and may contain errors in syntax, spelling, or content which have not been identified prior to signing this note.

## 2023-08-01 ENCOUNTER — Inpatient Hospital Stay

## 2023-08-01 ENCOUNTER — Encounter: Payer: Self-pay | Admitting: Hematology and Oncology

## 2023-08-01 ENCOUNTER — Inpatient Hospital Stay: Attending: Hematology and Oncology | Admitting: Hematology and Oncology

## 2023-08-01 ENCOUNTER — Other Ambulatory Visit (HOSPITAL_COMMUNITY): Payer: Self-pay

## 2023-08-01 ENCOUNTER — Other Ambulatory Visit: Payer: Self-pay

## 2023-08-01 VITALS — BP 180/57 | HR 68 | Temp 98.4°F | Resp 18 | Ht 70.0 in | Wt 203.0 lb

## 2023-08-01 DIAGNOSIS — M1611 Unilateral primary osteoarthritis, right hip: Secondary | ICD-10-CM | POA: Insufficient documentation

## 2023-08-01 DIAGNOSIS — I82412 Acute embolism and thrombosis of left femoral vein: Secondary | ICD-10-CM | POA: Insufficient documentation

## 2023-08-01 DIAGNOSIS — E119 Type 2 diabetes mellitus without complications: Secondary | ICD-10-CM | POA: Diagnosis not present

## 2023-08-01 DIAGNOSIS — Z7901 Long term (current) use of anticoagulants: Secondary | ICD-10-CM | POA: Insufficient documentation

## 2023-08-01 DIAGNOSIS — I5022 Chronic systolic (congestive) heart failure: Secondary | ICD-10-CM | POA: Insufficient documentation

## 2023-08-01 DIAGNOSIS — I2699 Other pulmonary embolism without acute cor pulmonale: Secondary | ICD-10-CM | POA: Insufficient documentation

## 2023-08-01 DIAGNOSIS — I82442 Acute embolism and thrombosis of left tibial vein: Secondary | ICD-10-CM | POA: Insufficient documentation

## 2023-08-01 MED ORDER — APIXABAN 5 MG PO TABS
5.0000 mg | ORAL_TABLET | Freq: Two times a day (BID) | ORAL | 0 refills | Status: DC
  Filled 2023-08-01 (×3): qty 60, 30d supply, fill #0

## 2023-08-01 NOTE — Assessment & Plan Note (Signed)
 I recommend the patient to defer surgery and to minimum 6 months of anticoagulation therapy I will see him in of August for further assessment

## 2023-08-01 NOTE — Assessment & Plan Note (Signed)
 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

## 2023-08-01 NOTE — Progress Notes (Signed)
 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 and acute PE I suspect the source of his PE is from the left lower extremity His risk factors included poor hydration and mobility I recommend minimum 6 months of anticoagulation therapy He will need to defer his elective hip replacement until after completion of minimum of 6 months of anticoagulation therapy  Diabetes mellitus without complication (HCC) He has not been compliant taking metformin and dietary modification We discussed importance of getting his diabetes under control prior to his surgery  Chronic heart failure with mildly reduced ejection fraction (HFmrEF, 41-49%) (HCC) Recent echocardiogram show reduced ejection fraction I do not believe the pulmonary emboli is the cause of this as to clot burden is not following We will continue close monitoring by his cardiologist and risk factor modifications  Unilateral primary osteoarthritis, right hip I recommend the patient to defer surgery and to minimum 6 months of anticoagulation therapy I will see him in of August for further assessment  Orders Placed This Encounter  Procedures   Basic Metabolic Panel - Cancer Center Only    Standing Status:   Future    Expiration Date:   07/31/2024   CBC with Differential (Cancer Center Only)    Standing Status:   Future    Expiration Date:   07/31/2024   D-dimer, quantitative    Standing Status:   Future    Expiration Date:   07/31/2024    All questions were answered. The patient knows to call the clinic with any problems, questions or concerns. The total time spent in the appointment was 60 minutes encounter with patients including review of chart and various tests results, discussions  about plan of care and coordination of care plan  Frederick Delay, MD 3/25/202510:32 AM  CHIEF COMPLAINTS/PURPOSE OF CONSULTATION:  Left lower extremity DVT, PE with mild cardiomyopathy  HISTORY OF PRESENTING ILLNESS:  Frederick Harris 73 y.o. male is here because of recent diagnosis of DVT and PE He is here accompanied by Frederick Harris, his wife The patient is semiretired He has severe osteoarthritis affecting his mobility The patient had multiple cardiovascular risk factors  Approximately a year ago, he fell on his left leg but did not receive any further evaluation He was admitted to the emergency department a month ago when he presented with right sided mid pain and some pleuritic chest pain He had evaluation including venous Doppler ultrasound as well as CT pulmonary angiogram  On 07/06/2023, venous Doppler ultra sound showed acute deep vein thrombosis involving the left femoral vein and age indeterminate deep vein thrombosis involving the left posterior tibial veins as well as age indeterminate superficial vein thrombosis involving the left small saphenous vein  CT pulmonary angiogram from 07/05/2023 showed small filling defect in the right lower lobe compatible with small nonocclusive pulmonary embolus  Echocardiogram performed on 07/05/2023 show reduced ejection fraction estimate 40 to 50% with global hypokinesis, mildly enlarged right ventricle  The patient received anticoagulation therapy and was discharged The patient denies any recent signs or symptoms of bleeding such as spontaneous epistaxis, hematuria or hematochezia.  He denies prior diagnosis of blood clot  He denies lower extremity swelling, warmth, tenderness & erythema.  He denies shortness of breath on minimal exertion,  pre-syncopal episodes, hemoptysis, or palpitation. He denies recent  history of trauma, long distance travel, dehydration, recent surgery, smoking or prolonged immobilization.  He has severe osteoarthritis impairing his  mobility on the right leg He had no prior history or diagnosis of cancer. His age appropriate screening programs are up-to-date. He had prior surgeries before and never had perioperative thromboembolic events. The patient had never used testosterone replacement therapy  There is strong family history of blood clots.  He has 12 other siblings, for siblings had blood clot diagnosis On average, he drinks approximately 64 ounces of liquid per day  MEDICAL HISTORY:  Past Medical History:  Diagnosis Date   CHF (congestive heart failure) (HCC)    Diabetes mellitus without complication (HCC)    Hypertension     SURGICAL HISTORY: History reviewed. No pertinent surgical history.  SOCIAL HISTORY: Social History   Socioeconomic History   Marital status: Married    Spouse name: Not on file   Number of children: 1   Years of education: Not on file   Highest education level: Not on file  Occupational History   Not on file  Tobacco Use   Smoking status: Never   Smokeless tobacco: Never  Vaping Use   Vaping status: Never Used  Substance and Sexual Activity   Alcohol use: Never   Drug use: Never   Sexual activity: Yes    Partners: Female    Comment: married  Other Topics Concern   Not on file  Social History Narrative   Not on file   Social Drivers of Health   Financial Resource Strain: Not on file  Food Insecurity: No Food Insecurity (07/05/2023)   Hunger Vital Sign    Worried About Running Out of Food in the Last Year: Never true    Ran Out of Food in the Last Year: Never true  Transportation Needs: No Transportation Needs (07/05/2023)   PRAPARE - Administrator, Civil Service (Medical): No    Lack of Transportation (Non-Medical): No  Physical Activity: Not on file  Stress: Not on file  Social Connections: Unknown (07/05/2023)   Social Connection and Isolation Panel [NHANES]    Frequency of Communication with Friends and Family: Twice a week    Frequency of Social  Gatherings with Friends and Family: Twice a week    Attends Religious Services: 1 to 4 times per year    Active Member of [employer] or Organizations: Yes    Attends Banker Meetings: 1 to 4 times per year    Marital Status: Patient unable to answer  Intimate Partner Violence: Not At Risk (07/05/2023)   Humiliation, Afraid, Rape, and Kick questionnaire    Fear of Current or Ex-Partner: No    Emotionally Abused: No    Physically Abused: No    Sexually Abused: No    FAMILY HISTORY: Family History  Problem Relation Age of Onset   Heart attack Mother    Heart attack Father    Clotting disorder Sister    Clotting disorder Brother     ALLERGIES:  is allergic to iodine and shellfish allergy.  MEDICATIONS:  Current Outpatient Medications  Medication Sig Dispense Refill   apixaban (ELIQUIS) 5 MG TABS tablet Take 1 tablet (5 mg total) by mouth 2 (two) times daily. 60 tablet 0   BLACK CURRANT SEED OIL PO Take 1 capsule by mouth daily.     carvedilol (COREG) 6.25 MG tablet Take 1 tablet (6.25 mg total) by mouth  2 (two) times daily with a meal. 120 tablet 1   Cholecalciferol (VITAMIN D3) LIQD Take 5 drops by mouth daily.     Coenzyme Q10 (COQ10 PO) Take 1 tablet by mouth daily.     Cyanocobalamin (VITAMIN B-12 PO) Take 1 tablet by mouth daily.     losartan-hydrochlorothiazide (HYZAAR) 100-25 MG tablet Take 1 tablet by mouth at bedtime.     MAGNESIUM PO Take 1 tablet by mouth daily.     metFORMIN (GLUCOPHAGE-XR) 500 MG 24 hr tablet Take 500 mg by mouth daily.     polyvinyl alcohol (LIQUIFILM TEARS) 1.4 % ophthalmic solution Place 1 drop into both eyes as needed for dry eyes.     No current facility-administered medications for this visit.    REVIEW OF SYSTEMS:   Constitutional: Denies fevers, chills or abnormal night sweats Eyes: Denies blurriness of vision, double vision or watery eyes Ears, nose, mouth, throat, and face: Denies mucositis or sore throat Respiratory: Denies  cough, dyspnea or wheezes Cardiovascular: Denies palpitation, chest discomfort or lower extremity swelling Gastrointestinal:  Denies nausea, heartburn or change in bowel habits Skin: Denies abnormal skin rashes Lymphatics: Denies new lymphadenopathy or easy bruising Neurological:Denies numbness, tingling or new weaknesses Behavioral/Psych: Mood is stable, no new changes  All other systems were reviewed with the patient and are negative.  PHYSICAL EXAMINATION: ECOG PERFORMANCE STATUS: 0 - Asymptomatic  Vitals:   08/01/23 0917  BP: (!) 180/57  Pulse: 68  Resp: 18  Temp: 98.4 F (36.9 C)  SpO2: 99%   Filed Weights   08/01/23 0917  Weight: 203 lb (92.1 kg)    GENERAL:alert, no distress and comfortable SKIN: skin color, texture, turgor are normal, no rashes or significant lesions EYES: normal, conjunctiva are pink and non-injected, sclera clear OROPHARYNX:no exudate, no erythema and lips, buccal mucosa, and tongue normal  NECK: supple, thyroid normal size, non-tender, without nodularity LYMPH:  no palpable lymphadenopathy in the cervical, axillary or inguinal LUNGS: clear to auscultation and percussion with normal breathing effort HEART: regular rate & rhythm and no murmurs and no lower extremity edema ABDOMEN:abdomen soft, non-tender and normal bowel sounds Musculoskeletal:no cyanosis of digits and no clubbing  PSYCH: alert & oriented x 3 with fluent speech NEURO: no focal motor/sensory deficits  LABORATORY DATA:  I have reviewed the data as listed Lab Results  Component Value Date   WBC 5.6 07/07/2023   HGB 11.7 (L) 07/07/2023   HCT 34.5 (L) 07/07/2023   MCV 84.6 07/07/2023   PLT 229 07/07/2023     RADIOGRAPHIC STUDIES: I have personally reviewed the radiological images as listed and agreed with the findings in the report. VAS [place] LOWER EXTREMITY VENOUS (DVT) Result Date: 07/07/2023  Lower Venous DVT Study Patient Name:  Frederick Harris  Date of Exam:   07/06/2023 Medical  Rec #: [id number]    Accession #:    [id number] Date of Birth: 1950/08/27   Patient Gender: M Patient Age:   12 years Exam Location[facility] Procedure:      VAS [place] LOWER EXTREMITY VENOUS (DVT) Referring Phys: Lyn Hollingshead MELVIN --------------------------------------------------------------------------------  Indications: Pulmonary embolism.  Comparison Study: No prior study on file Performing Technologist: Sherren Kerns RVS  Examination Guidelines: A complete evaluation includes B-mode imaging, spectral Doppler, color Doppler, and power Doppler as needed of all accessible portions of each vessel. Bilateral testing is considered an integral part of a complete examination. Limited examinations for reoccurring indications may be performed as noted. The reflux  portion of the exam is performed with the patient in reverse Trendelenburg.  +---------+---------------+---------+-----------+----------+--------------+ RIGHT    CompressibilityPhasicitySpontaneityPropertiesThrombus Aging +---------+---------------+---------+-----------+----------+--------------+ CFV      Full           Yes      Yes                                 +---------+---------------+---------+-----------+----------+--------------+ SFJ      Full                                                        +---------+---------------+---------+-----------+----------+--------------+ FV Prox  Full                                                        +---------+---------------+---------+-----------+----------+--------------+ FV Mid   Full                                                        +---------+---------------+---------+-----------+----------+--------------+ FV DistalFull                                                        +---------+---------------+---------+-----------+----------+--------------+ PFV      Full                                                         +---------+---------------+---------+-----------+----------+--------------+ POP      Full           Yes      Yes                                 +---------+---------------+---------+-----------+----------+--------------+ PTV      Full                                                        +---------+---------------+---------+-----------+----------+--------------+ PERO     Full                                                        +---------+---------------+---------+-----------+----------+--------------+ Gastroc  Full                                                        +---------+---------------+---------+-----------+----------+--------------+   +---------+---------------+---------+-----------+----------+-----------------+  LEFT     CompressibilityPhasicitySpontaneityPropertiesThrombus Aging    +---------+---------------+---------+-----------+----------+-----------------+ CFV      Full                                                           +---------+---------------+---------+-----------+----------+-----------------+ SFJ      Full                                                           +---------+---------------+---------+-----------+----------+-----------------+ FV Prox  Full                                                           +---------+---------------+---------+-----------+----------+-----------------+ FV Mid   Partial        Yes      Yes                  Acute             +---------+---------------+---------+-----------+----------+-----------------+ FV DistalNone           No       No                   Acute             +---------+---------------+---------+-----------+----------+-----------------+ PFV      Full                                                           +---------+---------------+---------+-----------+----------+-----------------+ POP      Full           Yes      Yes                                     +---------+---------------+---------+-----------+----------+-----------------+ PTV      Partial                                      Age Indeterminate +---------+---------------+---------+-----------+----------+-----------------+ PERO     Full                                                           +---------+---------------+---------+-----------+----------+-----------------+ SSV      None           No       No                   Age Indeterminate +---------+---------------+---------+-----------+----------+-----------------+     Summary: RIGHT: -  There is no evidence of deep vein thrombosis in the lower extremity.  - No cystic structure found in the popliteal fossa.  LEFT: - Findings consistent with acute deep vein thrombosis involving the left femoral vein.  - Findings consistent with age indeterminate deep vein thrombosis involving the left posterior tibial veins. - Findings consistent with age indeterminate superficial vein thrombosis involving the left small saphenous vein.  - No cystic structure found in the popliteal fossa.  *See table(s) above for measurements and observations. Electronically signed by Carolynn Sayers on 07/07/2023 at 7:20:07 AM.    Final    ECHOCARDIOGRAM COMPLETE Result Date: 07/05/2023    ECHOCARDIOGRAM REPORT   Patient Name:   Frederick Harris Date of Exam: 07/05/2023 Medical Rec #:  [id number]   Height:       70.0 in Accession #:    [id number]  Weight:       210.0 lb Date of Birth:  [birth year]  BSA:          2.131 m Patient Age:    72 years    BP:           160/69 mmHg Patient Gender: M           HR:           68 bpm. Exam Location:  Inpatient Procedure: 2D Echo, 3D Echo, Cardiac Doppler, Color Doppler, Strain Analysis and            Intracardiac Opacification Agent (Both Spectral and Color Flow            Doppler were utilized during procedure). Indications:    Pulmonary Embolus  History:        Patient has no prior history of Echocardiogram examinations.                  Risk Factors:Diabetes.  Sonographer:    Karma Ganja Referring Phys: Cecille Po MELVIN  Sonographer Comments: Global longitudinal strain was attempted. IMPRESSIONS  1. Left ventricular ejection fraction, by estimation, is 45 to 50%. Left ventricular ejection fraction by 3D volume is 40 % though this is mildly underestimated.. Left ventricular ejection fraction by 2D MOD biplane is 46.8 %. The left ventricle has mildly decreased function. The left ventricle has global hypokinesis. Left ventricular diastolic parameters are consistent with Grade I diastolic dysfunction (impaired relaxation). The average left ventricular global longitudinal strain is -10.6 %. The global longitudinal strain is abnormal.  2. Right ventricular systolic function is normal. The right ventricular size is mildly enlarged.  3. Right atrial size was mildly dilated.  4. The mitral valve is normal in structure. No evidence of mitral valve regurgitation. No evidence of mitral stenosis.  5. The aortic valve is normal in structure. Aortic valve regurgitation is mild to moderate. No aortic stenosis is present.  6. The inferior vena cava is normal in size with greater than 50% respiratory variability, suggesting right atrial pressure of 3 mmHg. FINDINGS  Left Ventricle: Left ventricular ejection fraction, by estimation, is 45 to 50%. Left ventricular ejection fraction by 2D MOD biplane is 46.8 %. Left ventricular ejection fraction by 3D volume is 40 %. The left ventricle has mildly decreased function. The left ventricle demonstrates global hypokinesis. Definity contrast agent was given IV to delineate the left ventricular endocardial borders. The average left ventricular global longitudinal strain is -10.6 %. Strain was performed and the global longitudinal strain is abnormal. The left ventricular internal cavity size was normal in size. There is no  left ventricular hypertrophy. Left ventricular diastolic parameters are consistent with Grade I  diastolic dysfunction (impaired relaxation). Right Ventricle: The right ventricular size is mildly enlarged. No increase in right ventricular wall thickness. Right ventricular systolic function is normal. Left Atrium: Left atrial size was normal in size. Right Atrium: Right atrial size was mildly dilated. Pericardium: There is no evidence of pericardial effusion. Mitral Valve: The mitral valve is normal in structure. No evidence of mitral valve regurgitation. No evidence of mitral valve stenosis. Tricuspid Valve: The tricuspid valve is normal in structure. Tricuspid valve regurgitation is trivial. No evidence of tricuspid stenosis. Aortic Valve: The aortic valve is normal in structure. Aortic valve regurgitation is mild to moderate. Aortic regurgitation PHT measures 481 msec. No aortic stenosis is present. Aortic valve mean gradient measures 4.0 mmHg. Aortic valve peak gradient measures 8.6 mmHg. Aortic valve area, by VTI measures 2.59 cm. Pulmonic Valve: The pulmonic valve was normal in structure. Pulmonic valve regurgitation is not visualized. No evidence of pulmonic stenosis. Aorta: The aortic root is normal in size and structure. Venous: The inferior vena cava is normal in size with greater than 50% respiratory variability, suggesting right atrial pressure of 3 mmHg. IAS/Shunts: No atrial level shunt detected by color flow Doppler. Additional Comments: 3D was performed not requiring image post processing on an independent workstation and was abnormal.  LEFT VENTRICLE PLAX 2D                        Biplane EF (MOD) LVIDd:         5.50 cm         LV Biplane EF:   Left LVIDs:         4.40 cm                          ventricular LV PW:         1.00 cm                          ejection LV IVS:        1.40 cm                          fraction by LVOT diam:     2.20 cm                          2D MOD LV SV:         76                               biplane is LV SV Index:   36                               46.8 %. LVOT  Area:     3.80 cm                                Diastology                                LV e' medial:    4.68 cm/s LV Volumes (MOD)  LV E/e' medial:  5.8 LV vol d, MOD    219.0 ml      LV e' lateral:   6.64 cm/s A2C:                           LV E/e' lateral: 4.1 LV vol d, MOD    185.0 ml A4C:                           2D LV vol s, MOD    109.0 ml      Longitudinal A2C:                           Strain LV vol s, MOD    102.0 ml      2D Strain GLS  -12.1 % A4C:                           (A2C): LV SV MOD A2C:   110.0 ml      2D Strain GLS  -10.7 % LV SV MOD A4C:   185.0 ml      (A3C): LV SV MOD BP:    94.7 ml       2D Strain GLS  -9.1 %                                (A4C):                                2D Strain GLS  -10.6 %                                Avg:                                 3D Volume EF                                LV 3D EF:    Left                                             ventricul                                             ar                                             ejection                                             fraction  by 3D                                             volume is                                             40 %.                                 3D Volume EF:                                3D EF:        40 %                                LV EDV:       228 ml                                LV ESV:       137 ml                                LV SV:        91 ml RIGHT VENTRICLE RV Basal diam:  4.60 cm RV S prime:     12.70 cm/s TAPSE (M-mode): 2.6 cm LEFT ATRIUM             Index        RIGHT ATRIUM           Index LA diam:        4.40 cm 2.06 cm/m   RA Area:     19.90 cm LA Vol (A2C):   67.1 ml 31.49 ml/m  RA Volume:   59.90 ml  28.11 ml/m LA Vol (A4C):   37.2 ml 17.46 ml/m LA Biplane Vol: 50.2 ml 23.56 ml/m  AORTIC VALVE AV Area (Vmax):    2.79 cm AV Area (Vmean):   2.71 cm AV Area (VTI):     2.59  cm AV Vmax:           147.00 cm/s AV Vmean:          95.500 cm/s AV VTI:            0.294 m AV Peak Grad:      8.6 mmHg AV Mean Grad:      4.0 mmHg LVOT Vmax:         108.00 cm/s LVOT Vmean:        68.100 cm/s LVOT VTI:          0.200 m LVOT/AV VTI ratio: 0.68 AI PHT:            481 msec  AORTA Ao Root diam: 3.40 cm MITRAL VALVE MV Area (PHT): 4.17 cm    SHUNTS MV Decel Time: 182 msec    Systemic VTI:  0.20 m MV E velocity: 27.30 cm/s  Systemic Diam: 2.20 cm MV A velocity: 82.30 cm/s  MV E/A ratio:  0.33 Clearnce Hasten Electronically signed by Clearnce Hasten Signature Date/Time: 07/05/2023/3:42:53 PM    Final    CT Angio Chest PE W and/or Wo Contrast Result Date: 07/05/2023 CLINICAL DATA:  Shortness of breath EXAM: CT ANGIOGRAPHY CHEST WITH CONTRAST TECHNIQUE: Multidetector CT imaging of the chest was performed using the standard protocol during bolus administration of intravenous contrast. Multiplanar CT image reconstructions and MIPs were obtained to evaluate the vascular anatomy. RADIATION DOSE REDUCTION: This exam was performed according to the departmental dose-optimization program which includes automated exposure control, adjustment of the mA and/or kV according to patient size and/or use of iterative reconstruction technique. CONTRAST:  75mL OMNIPAQUE IOHEXOL 350 MG/ML SOLN COMPARISON:  None Available. FINDINGS: Cardiovascular: Heart is normal size. Aorta is normal caliber. Partially occlusive small pulmonary embolus noted in the right lower lobe. No pulmonary emboli on the left. Scattered aortic calcifications. Mediastinum/Nodes: No mediastinal, hilar, or axillary adenopathy. Trachea and esophagus are unremarkable. Thyroid unremarkable. Lungs/Pleura: Trace right pleural effusion. Right base atelectasis. Left lung clear. Upper Abdomen: No acute findings Musculoskeletal: Mild bilateral gynecomastia. No acute bony abnormality. Review of the MIP images confirms the above findings. IMPRESSION: Small filling  defect in the right lower lobe compatible with small nonocclusive pulmonary embolus. Trace right pleural effusion, right base atelectasis. Aortic Atherosclerosis (ICD10-I70.0). Electronically Signed   By: Charlett Nose M.D.   On: 07/05/2023 03:50   DG Chest 2 View Result Date: 07/05/2023 CLINICAL DATA:  Right lower back pain EXAM: CHEST - 2 VIEW COMPARISON:  None Available. FINDINGS: The heart size and mediastinal contours are within normal limits. Both lungs are clear. The visualized skeletal structures are unremarkable. IMPRESSION: No active cardiopulmonary disease. Electronically Signed   By: Charlett Nose M.D.   On: 07/05/2023 00:25

## 2023-08-01 NOTE — Assessment & Plan Note (Signed)
 He has not been compliant taking metformin and dietary modification We discussed importance of getting his diabetes under control prior to his surgery

## 2023-08-01 NOTE — Assessment & Plan Note (Signed)
 Recent echocardiogram show reduced ejection fraction I do not believe the pulmonary emboli is the cause of this as to clot burden is not following We will continue close monitoring by his cardiologist and risk factor modifications

## 2023-08-02 ENCOUNTER — Other Ambulatory Visit (HOSPITAL_COMMUNITY): Payer: Self-pay

## 2023-08-08 ENCOUNTER — Ambulatory Visit: Admitting: Sports Medicine

## 2023-09-05 ENCOUNTER — Other Ambulatory Visit (HOSPITAL_COMMUNITY): Payer: Self-pay

## 2023-09-05 ENCOUNTER — Other Ambulatory Visit: Payer: Self-pay | Admitting: Hematology and Oncology

## 2023-09-05 MED ORDER — APIXABAN 5 MG PO TABS
5.0000 mg | ORAL_TABLET | Freq: Two times a day (BID) | ORAL | 4 refills | Status: DC
  Filled 2023-09-05: qty 60, 30d supply, fill #0
  Filled 2023-10-09: qty 60, 30d supply, fill #1
  Filled 2023-11-14: qty 60, 30d supply, fill #2
  Filled 2024-01-05: qty 60, 30d supply, fill #3
  Filled 2024-01-31: qty 60, 30d supply, fill #4

## 2023-09-27 ENCOUNTER — Encounter: Payer: Self-pay | Admitting: Cardiology

## 2023-10-09 ENCOUNTER — Other Ambulatory Visit (HOSPITAL_BASED_OUTPATIENT_CLINIC_OR_DEPARTMENT_OTHER): Payer: Self-pay

## 2023-10-26 ENCOUNTER — Other Ambulatory Visit: Payer: Self-pay | Admitting: Cardiology

## 2023-11-03 ENCOUNTER — Ambulatory Visit (HOSPITAL_COMMUNITY)
Admission: RE | Admit: 2023-11-03 | Discharge: 2023-11-03 | Disposition: A | Discharge status: Home / Self Care | Source: Ambulatory Visit | Attending: Cardiology | Admitting: Cardiology

## 2023-11-03 ENCOUNTER — Other Ambulatory Visit (HOSPITAL_COMMUNITY)

## 2023-11-03 DIAGNOSIS — I5021 Acute systolic (congestive) heart failure: Secondary | ICD-10-CM | POA: Diagnosis not present

## 2023-11-04 LAB — ECHOCARDIOGRAM COMPLETE
AR max vel: 2.5 cm2
AV Area VTI: 2.44 cm2
AV Area mean vel: 2.23 cm2
AV Mean grad: 3 mmHg
AV Peak grad: 5.9 mmHg
Ao pk vel: 1.21 m/s
Area-P 1/2: 3.72 cm2
S' Lateral: 3.63 cm

## 2023-11-05 ENCOUNTER — Ambulatory Visit: Payer: Self-pay | Admitting: Cardiology

## 2023-11-05 DIAGNOSIS — I351 Nonrheumatic aortic (valve) insufficiency: Secondary | ICD-10-CM

## 2023-11-05 DIAGNOSIS — I10 Essential (primary) hypertension: Secondary | ICD-10-CM

## 2023-11-05 DIAGNOSIS — I5021 Acute systolic (congestive) heart failure: Secondary | ICD-10-CM

## 2023-11-14 ENCOUNTER — Other Ambulatory Visit: Payer: Self-pay

## 2024-01-01 ENCOUNTER — Encounter (HOSPITAL_COMMUNITY): Payer: Self-pay

## 2024-01-03 ENCOUNTER — Other Ambulatory Visit: Payer: Self-pay | Admitting: Cardiology

## 2024-01-03 ENCOUNTER — Ambulatory Visit (HOSPITAL_COMMUNITY)
Admission: RE | Admit: 2024-01-03 | Discharge: 2024-01-03 | Disposition: A | Discharge status: Home / Self Care | Source: Ambulatory Visit | Attending: Cardiology | Admitting: Cardiology

## 2024-01-03 DIAGNOSIS — I351 Nonrheumatic aortic (valve) insufficiency: Secondary | ICD-10-CM

## 2024-01-03 MED ORDER — GADOBUTROL 1 MMOL/ML IV SOLN
10.0000 mL | Freq: Once | INTRAVENOUS | Status: AC | PRN
  Administered 2024-01-03: 10 mL via INTRAVENOUS

## 2024-01-05 ENCOUNTER — Other Ambulatory Visit (HOSPITAL_COMMUNITY): Payer: Self-pay

## 2024-01-05 ENCOUNTER — Inpatient Hospital Stay: Attending: Hematology and Oncology

## 2024-01-05 ENCOUNTER — Ambulatory Visit: Payer: Self-pay | Admitting: Cardiology

## 2024-01-05 ENCOUNTER — Inpatient Hospital Stay: Admitting: Hematology and Oncology

## 2024-01-05 ENCOUNTER — Encounter: Payer: Self-pay | Admitting: Hematology and Oncology

## 2024-01-05 VITALS — BP 179/78 | HR 75 | Temp 98.8°F | Resp 18 | Ht 70.0 in | Wt 208.6 lb

## 2024-01-05 DIAGNOSIS — I82502 Chronic embolism and thrombosis of unspecified deep veins of left lower extremity: Secondary | ICD-10-CM | POA: Insufficient documentation

## 2024-01-05 DIAGNOSIS — Z832 Family history of diseases of the blood and blood-forming organs and certain disorders involving the immune mechanism: Secondary | ICD-10-CM | POA: Diagnosis not present

## 2024-01-05 DIAGNOSIS — I5022 Chronic systolic (congestive) heart failure: Secondary | ICD-10-CM

## 2024-01-05 DIAGNOSIS — I2699 Other pulmonary embolism without acute cor pulmonale: Secondary | ICD-10-CM

## 2024-01-05 DIAGNOSIS — Z7901 Long term (current) use of anticoagulants: Secondary | ICD-10-CM | POA: Diagnosis not present

## 2024-01-05 DIAGNOSIS — M1990 Unspecified osteoarthritis, unspecified site: Secondary | ICD-10-CM | POA: Insufficient documentation

## 2024-01-05 LAB — CBC WITH DIFFERENTIAL (CANCER CENTER ONLY)
Abs Immature Granulocytes: 0.02 K/uL (ref 0.00–0.07)
Basophils Absolute: 0 K/uL (ref 0.0–0.1)
Basophils Relative: 1 %
Eosinophils Absolute: 0.1 K/uL (ref 0.0–0.5)
Eosinophils Relative: 1 %
HCT: 38.7 % — ABNORMAL LOW (ref 39.0–52.0)
Hemoglobin: 13.1 g/dL (ref 13.0–17.0)
Immature Granulocytes: 0 %
Lymphocytes Relative: 23 %
Lymphs Abs: 1.5 K/uL (ref 0.7–4.0)
MCH: 28.4 pg (ref 26.0–34.0)
MCHC: 33.9 g/dL (ref 30.0–36.0)
MCV: 83.9 fL (ref 80.0–100.0)
Monocytes Absolute: 0.6 K/uL (ref 0.1–1.0)
Monocytes Relative: 9 %
Neutro Abs: 4.2 K/uL (ref 1.7–7.7)
Neutrophils Relative %: 66 %
Platelet Count: 279 K/uL (ref 150–400)
RBC: 4.61 MIL/uL (ref 4.22–5.81)
RDW: 13 % (ref 11.5–15.5)
WBC Count: 6.4 K/uL (ref 4.0–10.5)
nRBC: 0 % (ref 0.0–0.2)

## 2024-01-05 LAB — BASIC METABOLIC PANEL - CANCER CENTER ONLY
Anion gap: 7 (ref 5–15)
BUN: 17 mg/dL (ref 8–23)
CO2: 25 mmol/L (ref 22–32)
Calcium: 9.4 mg/dL (ref 8.9–10.3)
Chloride: 104 mmol/L (ref 98–111)
Creatinine: 1.03 mg/dL (ref 0.61–1.24)
GFR, Estimated: >60 mL/min (ref >60)
Glucose, Bld: 124 mg/dL — ABNORMAL HIGH (ref 70–99)
Potassium: 4 mmol/L (ref 3.5–5.1)
Sodium: 136 mmol/L (ref 135–145)

## 2024-01-05 LAB — D-DIMER, QUANTITATIVE: D-Dimer, Quant: 1.59 ug{FEU}/mL — ABNORMAL HIGH (ref 0.00–0.50)

## 2024-01-05 NOTE — Assessment & Plan Note (Addendum)
 Recent echocardiogram showed stable reduced ejection fraction He will continue medical management as directed by his cardiologist

## 2024-01-05 NOTE — Progress Notes (Signed)
 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 and diagnosis of acute PE in February 2025 I suspect the source of his PE is from the left lower extremity Over the past 6 months, he is doing well on anticoagulation therapy without complications The patient has modified his lifestyle with increase mobility and oral fluid intake Moving forward, there is no contraindication for him to proceed with hip replacement surgery in the near future I will coordinate care with orthopedic surgeon in terms of perioperative anticoagulation management The duration of anticoagulation therapy for him would be minimum for a year, potentially longer depending on his future mobility and other risk factors Chronic heart failure with mildly reduced ejection fraction (HFmrEF, 41-49%) (HCC) Recent echocardiogram showed stable reduced ejection fraction He will continue medical management as directed by his cardiologist  No orders of the defined types were placed in this encounter.    Almarie Bedford, MD  INTERVAL HISTORY: he returns for surveillance follow-up for history of DVT/PE Patient denies recent bleeding such as epistaxis, hematuria or hematochezia We reviewed medication list We discussed test results and timing of his anticipated elective hip replacement surgery and perioperative anticoagulation management  PHYSICAL EXAMINATION: ECOG PERFORMANCE STATUS: 0 - Asymptomatic  Vitals:   01/05/24 1005  BP: (!) 179/78  Pulse: 75  Resp: 18  Temp: 98.8 F (37.1 C)  SpO2: 99%   Lab Results  Component Value Date   WBC 5.6 07/07/2023   HGB 11.7 (L) 07/07/2023   HCT 34.5 (L) 07/07/2023   MCV 84.6  07/07/2023   PLT 229 07/07/2023    SUMMARY OF HEMATOLOGIC HISTORY: Frederick Harris 73 y.o. male is here because of recent diagnosis of DVT and PE He is here accompanied by Frederick Harris, his wife The patient is semiretired He has severe osteoarthritis affecting his mobility The patient had multiple cardiovascular risk factors  Approximately a year ago, he fell on his left leg but did not receive any further evaluation He was admitted to the emergency department a month ago when he presented with right sided mid pain and some pleuritic chest pain He had evaluation including venous Doppler ultrasound as well as CT pulmonary angiogram  On 07/06/2023, venous Doppler ultra sound showed acute deep vein thrombosis involving the left femoral vein and age indeterminate deep vein thrombosis involving the left posterior tibial veins as well as age indeterminate superficial vein thrombosis involving the left small saphenous vein  CT pulmonary angiogram from 07/05/2023 showed small filling defect in the right lower lobe compatible with small nonocclusive pulmonary embolus  Echocardiogram performed on 07/05/2023 show reduced ejection fraction estimate 40 to 50% with global hypokinesis, mildly enlarged right ventricle  The patient received anticoagulation therapy and was discharged The patient denies any recent signs or symptoms of bleeding such as spontaneous epistaxis, hematuria or hematochezia.  He denies prior diagnosis of blood clot  He denies lower extremity swelling, warmth, tenderness & erythema.  He denies shortness of breath on minimal exertion, pre-syncopal episodes, hemoptysis, or palpitation. He denies recent  history of trauma, long distance travel, dehydration, recent surgery, smoking or prolonged immobilization.  He has severe osteoarthritis impairing his mobility on the right leg He had no prior history or diagnosis  of cancer. His age appropriate screening programs are up-to-date. He had prior  surgeries before and never had perioperative thromboembolic events. The patient had never used testosterone replacement therapy  There is strong family history of blood clots.  He has 12 other siblings, for siblings had blood clot diagnosis On average, he drinks approximately 64 ounces of liquid per day

## 2024-01-05 NOTE — Assessment & Plan Note (Addendum)
 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 and diagnosis of acute PE in February 2025 I suspect the source of his PE is from the left lower extremity Over the past 6 months, he is doing well on anticoagulation therapy without complications The patient has modified his lifestyle with increase mobility and oral fluid intake Moving forward, there is no contraindication for him to proceed with hip replacement surgery in the near future I will coordinate care with orthopedic surgeon in terms of perioperative anticoagulation management The duration of anticoagulation therapy for him would be minimum for a year, potentially longer depending on his future mobility and other risk factors

## 2024-01-14 NOTE — Progress Notes (Unsigned)
 Cardiology Office Note:    Date:  01/14/2024   ID:  Frederick Harris, DOB [birth year], MRN [id number]  PCP:  Joshua Francisco, MD  Cardiologist:  Lonni LITTIE Nanas, MD  Electrophysiologist:  None   Referring MD: Joshua Francisco, MD   No chief complaint on file.   History of Present Illness:    Frederick Harris is a 73 y.o. male with a hx of PE/DVT, hypertension, hyperlipidemia who presents for follow-up.  He was referred by Dr. Joshua for evaluation of systolic heart failure, initially seen 07/2023.  He was admitted on 07/05/2023, found to have acute right lower lobe subsegmental PE along with acute left lower extremity DVT.  He was started on Eliquis .  Echocardiogram 07/05/2023 showed EF 45 to 50%, normal RV function, mild RV enlargement, mild to moderate aortic regurgitation.  Echocardiogram 10/2023 showed EF 50 to 55%, normal RV function, moderate aortic regurgitation.  Cardiac MRI 01/04/2024 showed LVEF 45%, RVEF 42%, basal septal mid wall LGE, RV insertion site LGE, moderate to severe aortic regurgitation (35% regurgitant fraction), moderate pulm monic regurgitation (regurgitant fraction 24%).  Since last clinic visit,  he reports he is doing well.  Denies any chest pain, dyspnea, lightheadedness, syncope, lower extremity edema, or palpitations.  Denies any bleeding on Eliquis .   Past Medical History:  Diagnosis Date   CHF (congestive heart failure) (HCC)    Diabetes mellitus without complication (HCC)    Hypertension     No past surgical history on file.  Current Medications: No outpatient medications have been marked as taking for the 01/17/24 encounter (Appointment) with Nanas Lonni LITTIE, MD.     Allergies:   Iodine and Shellfish allergy   Social History   Socioeconomic History   Marital status: Married    Spouse name: Not on file   Number of children: 1   Years of education: Not on file   Highest education level: Not on file  Occupational History   Not on file  Tobacco  Use   Smoking status: Never   Smokeless tobacco: Never  Vaping Use   Vaping status: Never Used  Substance and Sexual Activity   Alcohol  use: Never   Drug use: Never   Sexual activity: Yes    Partners: Female    Comment: married  Other Topics Concern   Not on file  Social History Narrative   Not on file   Social Drivers of Health   Financial Resource Strain: Not on file  Food Insecurity: No Food Insecurity (07/05/2023)   Hunger Vital Sign    Worried About Running Out of Food in the Last Year: Never true    Ran Out of Food in the Last Year: Never true  Transportation Needs: No Transportation Needs (07/05/2023)   PRAPARE - Administrator, Civil Service (Medical): No    Lack of Transportation (Non-Medical): No  Physical Activity: Not on file  Stress: Not on file  Social Connections: Unknown (07/05/2023)   Social Connection and Isolation Panel    Frequency of Communication with Friends and Family: Twice a week    Frequency of Social Gatherings with Friends and Family: Twice a week    Attends Religious Services: 1 to 4 times per year    Active Member of [employer] or Organizations: Yes    Attends Banker Meetings: 1 to 4 times per year    Marital Status: Patient unable to answer     Family History: The patient's family history includes Clotting  disorder in his brother and sister; Heart attack in his father and mother.  ROS:   Please see the history of present illness.     All other systems reviewed and are negative.  EKGs/Labs/Other Studies Reviewed:    The following studies were reviewed today:   EKG:  EKG is not ordered today.    Recent Labs: 01/05/2024: BUN 17; Creatinine 1.03; Hemoglobin 13.1; Platelet Count 279; Potassium 4.0; Sodium 136  Recent Lipid Panel No results found for: CHOL, TRIG, HDL, CHOLHDL, VLDL, LDLCALC, LDLDIRECT  Physical Exam:    VS:  There were no vitals taken for this visit.    Wt Readings from Last 3  Encounters:  01/05/24 208 lb 9.6 oz (94.6 kg)  08/01/23 203 lb (92.1 kg)  07/28/23 207 lb 12.8 oz (94.3 kg)     GEN:  Well nourished, well developed in no acute distress HEENT: Normal NECK: No JVD; No carotid bruits LYMPHATICS: No lymphadenopathy CARDIAC: RRR, no murmurs, rubs, gallops RESPIRATORY:  Clear to auscultation without rales, wheezing or rhonchi  ABDOMEN: Soft, non-tender, non-distended MUSCULOSKELETAL:  No edema; No deformity  SKIN: Warm and dry NEUROLOGIC:  Alert and oriented x 3 PSYCHIATRIC:  Normal affect   ASSESSMENT:    No diagnosis found.  PLAN:    Acute systolic heart failure: Noted during admission for acute PE 06/2023.  Echocardiogram 07/05/2023 showed EF 45 to 50%, normal RV function, mild RV enlargement, mild to moderate aortic regurgitation.  Echocardiogram 10/2023 showed EF 50 to 55%, normal RV function, moderate aortic regurgitation.  Cardiac MRI 01/04/2024 showed LVEF 45%, RVEF 42%, basal septal mid wall LGE, RV insertion site LGE, moderate to severe aortic regurgitation (35% regurgitant fraction), moderate pulm monic regurgitation (regurgitant fraction 24%). -Continue losartan  100 mg daily.  Continue carvedilol  6.25 mg twice daily.  Add GDMT*** - Recommend ischemic evaluation with stress PET***  Aortic regurgitation: Moderate on echocardiogram 10/2023.  Cardiac MRI 12/2023 showed moderate to severe aortic regurgitation.  Monitor, plan repeat echocardiogram in 6 months***  Hypertension: On losartan /HCTZ 100-25 mg daily, carvedilol  6.25 mg twice daily  Acute PE: Diagnosed during admission 06/2023.  Continue Eliquis  5 mg twice daily  T2DM: On metformin .  A1c 6.8% on 07/06/2023  Hyperlipidemia: Reports was started on statin, but does not remember what he is on.  Asked him to call and let us  know what he is taking***  RTC in 3 months***  Medication Adjustments/Labs and Tests Ordered: Current medicines are reviewed at length with the patient today.  Concerns  regarding medicines are outlined above.  No orders of the defined types were placed in this encounter.  No orders of the defined types were placed in this encounter.   There are no Patient Instructions on file for this visit.   Signed, Lonni LITTIE Nanas, MD  01/14/2024 2:16 PM    [facility]

## 2024-01-17 ENCOUNTER — Ambulatory Visit (HOSPITAL_BASED_OUTPATIENT_CLINIC_OR_DEPARTMENT_OTHER): Admitting: Cardiology

## 2024-01-17 ENCOUNTER — Encounter (HOSPITAL_BASED_OUTPATIENT_CLINIC_OR_DEPARTMENT_OTHER): Payer: Self-pay | Admitting: Cardiology

## 2024-01-17 ENCOUNTER — Telehealth (HOSPITAL_COMMUNITY): Payer: Self-pay | Admitting: *Deleted

## 2024-01-17 VITALS — BP 160/80 | HR 81 | Ht 70.0 in | Wt 206.2 lb

## 2024-01-17 DIAGNOSIS — Z01818 Encounter for other preprocedural examination: Secondary | ICD-10-CM

## 2024-01-17 DIAGNOSIS — I10 Essential (primary) hypertension: Secondary | ICD-10-CM

## 2024-01-17 DIAGNOSIS — I351 Nonrheumatic aortic (valve) insufficiency: Secondary | ICD-10-CM | POA: Diagnosis not present

## 2024-01-17 DIAGNOSIS — E785 Hyperlipidemia, unspecified: Secondary | ICD-10-CM

## 2024-01-17 DIAGNOSIS — I5022 Chronic systolic (congestive) heart failure: Secondary | ICD-10-CM

## 2024-01-17 MED ORDER — CARVEDILOL 6.25 MG PO TABS: 6.2500 mg | ORAL_TABLET | Freq: Two times a day (BID) | ORAL | 2 refills | Status: AC

## 2024-01-17 NOTE — Telephone Encounter (Signed)
 Pt given instructions for stress test.

## 2024-01-17 NOTE — Patient Instructions (Addendum)
 Medication Instructions:  RESUME CARVEDILOL  6.25 MG TWICE A DAY   *If you need a refill on your cardiac medications before your next appointment, please call your pharmacy*  Lab Work: NONE   Testing/Procedures: Your physician has requested that you have a lexiscan myoview. For further information please visit [email or web address] Please follow instruction sheet, as given.  Follow-Up: At [facility], you and your health needs are our priority.  As part of our continuing mission to provide you with exceptional heart care, our providers are all part of one team.  This team includes your primary Cardiologist (physician) and Advanced Practice Providers or APPs (Physician Assistants and Nurse Practitioners) who all work together to provide you with the care you need, when you need it.  Your next appointment:   3 month(s) WITH DR [facility] OR APP   We recommend signing up for the patient portal called MyChart.  Sign up information is provided on this After Visit Summary.  MyChart is used to connect with patients for Virtual Visits (Telemedicine).  Patients are able to view lab/test results, encounter notes, upcoming appointments, etc.  Non-urgent messages can be sent to your provider as well.   To learn more about what you can do with MyChart, go to [email or web address].   Other Instructions MONITOR BLOOD PRESSURE DAILY FOR 1 WEEK, SEND VIA MYCHART OR CALL WITH READINGS   Lexiscan Myocardial Perfusion Imaging Study.  Please arrive 15 minutes prior to your appointment time for registration and insurance purposes.   The test will take approximately 3 to 4 hours to complete; you may bring reading material.  If someone comes with you to your appointment, they will need to remain in the main lobby due to limited space in the testing area. **If you are pregnant or breastfeeding, please notify the nuclear lab prior to your appointment**   How to prepare for your Myocardial Perfusion  Test: Do not eat or drink 3 hours prior to your test, except you may have water. Do not consume products containing caffeine (regular or decaffeinated) 12 hours prior to your test. (ex: coffee, chocolate, sodas, tea). Do wear comfortable clothes (no dresses or overalls) and walking shoes, tennis shoes preferred (No heels or open toe shoes are allowed). Do NOT wear cologne, perfume, aftershave, or lotions (deodorant is allowed). If you use an inhaler, use it the AM of your test and bring it with you.  If you use a nebulizer, use it the AM of your test.  If these instructions are not followed, your test will have to be rescheduled.

## 2024-01-22 ENCOUNTER — Encounter: Payer: Self-pay | Admitting: Physician Assistant

## 2024-01-22 ENCOUNTER — Other Ambulatory Visit (HOSPITAL_BASED_OUTPATIENT_CLINIC_OR_DEPARTMENT_OTHER): Payer: Self-pay | Admitting: Cardiology

## 2024-01-22 ENCOUNTER — Ambulatory Visit (INDEPENDENT_AMBULATORY_CARE_PROVIDER_SITE_OTHER): Admitting: Physician Assistant

## 2024-01-22 VITALS — Ht 70.0 in | Wt 206.0 lb

## 2024-01-22 DIAGNOSIS — E785 Hyperlipidemia, unspecified: Secondary | ICD-10-CM

## 2024-01-22 DIAGNOSIS — I5022 Chronic systolic (congestive) heart failure: Secondary | ICD-10-CM

## 2024-01-22 DIAGNOSIS — M1611 Unilateral primary osteoarthritis, right hip: Secondary | ICD-10-CM | POA: Diagnosis not present

## 2024-01-22 DIAGNOSIS — I10 Essential (primary) hypertension: Secondary | ICD-10-CM

## 2024-01-22 DIAGNOSIS — Z01818 Encounter for other preprocedural examination: Secondary | ICD-10-CM

## 2024-01-22 NOTE — Progress Notes (Signed)
 HPI: [name] comes back in today due to bilateral hip pain.  He states that the intra-articular injection of the right hip on 07/31/2023 gave him no relief.  He is wanting to proceed with surgery.  He states that all of my doctors have cleared me for surgery.  Since we last saw him he did have a DVT in the left lower extremity and was placed on Eliquis .  He reports that his diabetes is under good control last hemoglobin A1c of 6.8.  He denies any fevers chills shortness of breath chest pain.  He is walking with walking sticks due to bilateral hip pain.  Currently is right hip pain is worse than his left.  Right hip pain is 10 out of 10 with ambulation.  Review of systems: See HPI otherwise negative  Physical exam: General Well-developed well-nourished male in no acute distress mood and affect appropriate.  Psych alert and oriented x 3. Bilateral hips: Left hip overall slightly limited internal/external rotation but no pain.  Right hip has severe pain virtually no internal rotation.  Dorsiflexion plantarflexion bilateral ankles intact.  Impression: End-stage arthritis right hip  Plan: Given the fact that patient's right hip pain is severe and greatly affects his quality of life recommend right total hip arthroplasty.  Risk benefits of surgery discussed with him again.  Questions were encouraged and answered.  About the surgical procedure and the postoperative protocol.  Risk include but not limited to DVT/PE, wound healing problems, infection, blood loss, nerve vessel injury and leg length discrepancy.  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  He will need cardiac clearance and will need to come off Eliquis  3 days prior to surgery.  Questions were encouraged and answered at length.  Work on scheduling in the near future for a right total hip arthroplasty

## 2024-01-23 ENCOUNTER — Ambulatory Visit (HOSPITAL_COMMUNITY)
Admission: RE | Admit: 2024-01-23 | Discharge: 2024-01-23 | Disposition: A | Discharge status: Home / Self Care | Source: Ambulatory Visit | Attending: Cardiovascular Disease | Admitting: Cardiovascular Disease

## 2024-01-23 DIAGNOSIS — I5022 Chronic systolic (congestive) heart failure: Secondary | ICD-10-CM | POA: Insufficient documentation

## 2024-01-23 DIAGNOSIS — Z01818 Encounter for other preprocedural examination: Secondary | ICD-10-CM | POA: Insufficient documentation

## 2024-01-23 DIAGNOSIS — I10 Essential (primary) hypertension: Secondary | ICD-10-CM | POA: Insufficient documentation

## 2024-01-23 DIAGNOSIS — E785 Hyperlipidemia, unspecified: Secondary | ICD-10-CM | POA: Diagnosis present

## 2024-01-23 LAB — MYOCARDIAL PERFUSION IMAGING
Base ST Depression (mm): 0 mm
LV dias vol: 194 mL (ref 62–150)
LV sys vol: 107 mL (ref 4.2–5.8)
Nuc Stress EF: 45 %
Peak HR: 92 {beats}/min
Rest HR: 56 {beats}/min
Rest Nuclear Isotope Dose: 10.8 mCi
SDS: 2
SRS: 7
SSS: 5
ST Depression (mm): 0 mm
Stress Nuclear Isotope Dose: 32.6 mCi
TID: 0.96

## 2024-01-23 MED ORDER — TECHNETIUM TC 99M TETROFOSMIN IV KIT
10.8000 | PACK | Freq: Once | INTRAVENOUS | Status: AC | PRN
  Administered 2024-01-23: 10.8 via INTRAVENOUS

## 2024-01-23 MED ORDER — REGADENOSON 0.4 MG/5ML IV SOLN
INTRAVENOUS | Status: AC
  Filled 2024-01-23: qty 5

## 2024-01-23 MED ORDER — TECHNETIUM TC 99M TETROFOSMIN IV KIT
32.6000 | PACK | Freq: Once | INTRAVENOUS | Status: AC | PRN
  Administered 2024-01-23: 32.6 via INTRAVENOUS

## 2024-01-23 MED ORDER — REGADENOSON 0.4 MG/5ML IV SOLN
0.4000 mg | Freq: Once | INTRAVENOUS | Status: AC
  Administered 2024-01-23: 0.4 mg via INTRAVENOUS

## 2024-01-24 ENCOUNTER — Ambulatory Visit: Payer: Self-pay | Admitting: Cardiology

## 2024-01-28 NOTE — Progress Notes (Unsigned)
 Cardiology Office Note:    Date:  01/31/2024   ID:  Frederick Harris, DOB 07/07/1950, MRN [id number]  PCP:  Joshua Francisco, MD  Cardiologist:  Lonni LITTIE Nanas, MD  Electrophysiologist:  None   Referring MD: Joshua Francisco, MD   Chief Complaint  Patient presents with   abnormal stress test    History of Present Illness:    Frederick Harris is a 73 y.o. male with a hx of PE/DVT, hypertension, hyperlipidemia who presents for follow-up.  He was referred by Dr. Joshua for evaluation of systolic heart failure, initially seen 07/2023.  He was admitted on 07/05/2023, found to have acute right lower lobe subsegmental PE along with acute left lower extremity DVT.  He was started on Eliquis .  Echocardiogram 07/05/2023 showed EF 45 to 50%, normal RV function, mild RV enlargement, mild to moderate aortic regurgitation.  Echocardiogram 10/2023 showed EF 50 to 55%, normal RV function, moderate aortic regurgitation.  Cardiac MRI 01/04/2024 showed LVEF 45%, RVEF 42%, basal septal mid wall LGE, RV insertion site LGE, moderate to severe aortic regurgitation (35% regurgitant fraction), moderate pulmonic regurgitation (regurgitant fraction 24%).  Lexiscan  Myoview  01/23/2024 showed reversible perfusion defect in inferior wall concerning for possible ischemia, LVEF 45%; findings concerning for possible ischemia versus artifact; no coronary calcifications on CT.  Since last clinic visit, he reports he is doing well.  Denies any chest pain, dyspnea, lightheadedness, syncope, lower extremity edema, or palpitations.  He reports compliance with Eliquis , denies any bleeding issues.   Past Medical History:  Diagnosis Date   CHF (congestive heart failure) (HCC)    Diabetes mellitus without complication (HCC)    Hypertension     History reviewed. No pertinent surgical history.  Current Medications: Current Meds  Medication Sig   apixaban  (ELIQUIS ) 5 MG TABS tablet Take 1 tablet (5 mg total) by mouth 2 (two) times daily.    atorvastatin (LIPITOR) 20 MG tablet Take 20 mg by mouth daily.   BLACK CURRANT SEED OIL PO Take 1 capsule by mouth daily.   carvedilol  (COREG ) 6.25 MG tablet Take 1 tablet (6.25 mg total) by mouth 2 (two) times daily with a meal.   Cholecalciferol (VITAMIN D3) LIQD Take 5 drops by mouth daily.   Coenzyme Q10 (COQ10 PO) Take 1 tablet by mouth daily.   Cyanocobalamin (VITAMIN B-12 PO) Take 1 tablet by mouth daily.   losartan -hydrochlorothiazide  (HYZAAR) 100-25 MG tablet Take 1 tablet by mouth at bedtime.   MAGNESIUM PO Take 1 tablet by mouth daily.   metFORMIN  (GLUCOPHAGE -XR) 500 MG 24 hr tablet Take 500 mg by mouth daily. Per patient he does not take daily, not taken in a while   polyvinyl alcohol  (LIQUIFILM TEARS) 1.4 % ophthalmic solution Place 1 drop into both eyes as needed for dry eyes.     Allergies:   Iodine and Shellfish allergy   Social History   Socioeconomic History   Marital status: Married    Spouse name: Not on file   Number of children: 1   Years of education: Not on file   Highest education level: Not on file  Occupational History   Not on file  Tobacco Use   Smoking status: Never   Smokeless tobacco: Never  Vaping Use   Vaping status: Never Used  Substance and Sexual Activity   Alcohol  use: Never   Drug use: Never   Sexual activity: Yes    Partners: Female    Comment: married  Other Topics Concern  Not on file  Social History Narrative   Not on file   Social Drivers of Health   Financial Resource Strain: Not on file  Food Insecurity: No Food Insecurity (07/05/2023)   Hunger Vital Sign    Worried About Running Out of Food in the Last Year: Never true    Ran Out of Food in the Last Year: Never true  Transportation Needs: No Transportation Needs (07/05/2023)   PRAPARE - Administrator, Civil Service (Medical): No    Lack of Transportation (Non-Medical): No  Physical Activity: Not on file  Stress: Not on file  Social Connections: Unknown  (07/05/2023)   Social Connection and Isolation Panel    Frequency of Communication with Friends and Family: Twice a week    Frequency of Social Gatherings with Friends and Family: Twice a week    Attends Religious Services: 1 to 4 times per year    Active Member of [employer] or Organizations: Yes    Attends Banker Meetings: 1 to 4 times per year    Marital Status: Patient unable to answer     Family History: The patient's family history includes Clotting disorder in his brother and sister; Heart attack in his father and mother.  ROS:   Please see the history of present illness.     All other systems reviewed and are negative.  EKGs/Labs/Other Studies Reviewed:    The following studies were reviewed today:   EKG:   01/31/2024: Normal sinus rhythm, rate 60, no ST abnormalities  Recent Labs: 01/05/2024: BUN 17; Creatinine 1.03; Hemoglobin 13.1; Platelet Count 279; Potassium 4.0; Sodium 136  Recent Lipid Panel No results found for: CHOL, TRIG, HDL, CHOLHDL, VLDL, LDLCALC, LDLDIRECT  Physical Exam:    VS:  BP (!) 164/62   Pulse 60   Ht 5' 10 (1.778 m)   Wt 211 lb 9.6 oz (96 kg)   SpO2 99%   BMI 30.36 kg/m     Wt Readings from Last 3 Encounters:  01/31/24 211 lb 9.6 oz (96 kg)  01/22/24 206 lb (93.4 kg)  01/17/24 206 lb 3.2 oz (93.5 kg)     GEN:  Well nourished, well developed in no acute distress HEENT: Normal NECK: No JVD; No carotid bruits LYMPHATICS: No lymphadenopathy CARDIAC: RRR, no murmurs, rubs, gallops RESPIRATORY:  Clear to auscultation without rales, wheezing or rhonchi  ABDOMEN: Soft, non-tender, non-distended MUSCULOSKELETAL:  No edema; No deformity  SKIN: Warm and dry NEUROLOGIC:  Alert and oriented x 3 PSYCHIATRIC:  Normal affect   ASSESSMENT:    1. Chronic systolic heart failure (HCC)   2. Abnormal stress test   3. Essential hypertension   4. Aortic valve insufficiency, etiology of cardiac valve disease unspecified   5.  Pre-op evaluation   6. Acute pulmonary embolism without acute cor pulmonale, unspecified pulmonary embolism type (HCC)      PLAN:    Chronic systolic heart failure: Noted during admission for acute PE 06/2023.  Echocardiogram 07/05/2023 showed EF 45 to 50%, normal RV function, mild RV enlargement, mild to moderate aortic regurgitation.  Echocardiogram 10/2023 showed EF 50 to 55%, normal RV function, moderate aortic regurgitation.  Cardiac MRI 01/04/2024 showed LVEF 45%, RVEF 42%, basal septal mid wall LGE, RV insertion site LGE, moderate to severe aortic regurgitation (35% regurgitant fraction), moderate pulmonic regurgitation (regurgitant fraction 24%).  Lexiscan  Myoview  01/23/2024 showed reversible perfusion defect in inferior wall concerning for possible ischemia, LVEF 45%; findings concerning for possible ischemia versus  artifact; no coronary calcifications on CT. - Continue losartan  100 mg daily.  Reports he stopped taking his carvedilol , recommend restarting carvedilol  6.25 mg twice daily - Given possible ischemia on Lexiscan  vs artifact, recommend coronary CTA for further evaluation.  Preop evaluation: Prior to hip replacement.  He denies any exertional chest pain or dyspnea.  However given his systolic dysfunction as above, recommended Lexiscan  Myoview  to rule out ischemia prior to his procedure.  Lexiscan  Myoview  showed possible inferior ischemia, plan coronary CTA for further evaluation as above  Aortic regurgitation: Moderate on echocardiogram 10/2023.  Cardiac MRI 12/2023 showed moderate to severe aortic regurgitation.  Monitor, plan repeat echocardiogram in 6 months to monitor  Hypertension: On losartan /HCTZ 100-25 mg daily and carvedilol  6.25 mg twice daily.  Asked to check BP twice daily for next week and let us  know results  Acute PE: Diagnosed during admission 06/2023.  Continue Eliquis  5 mg twice daily.  Follows with hematology  T2DM: On metformin .  A1c 6.8% on  07/06/2023  Hyperlipidemia: on atorvastatin 20 mg daily  RTC in 3 months  Medication Adjustments/Labs and Tests Ordered: Current medicines are reviewed at length with the patient today.  Concerns regarding medicines are outlined above.  Orders Placed This Encounter  Procedures   CT CORONARY MORPH W/CTA COR W/SCORE W/CA W/CM &/OR WO/CM   Basic metabolic panel with GFR   EKG 87-[place]   No orders of the defined types were placed in this encounter.   Patient Instructions  Medication Instructions:  Continue current medications *If you need a refill on your cardiac medications before your next appointment, please call your pharmacy*  Lab Work: Bmet today If you have labs (blood work) drawn today and your tests are completely normal, you will receive your results only by: MyChart Message (if you have MyChart) OR A paper copy in the mail If you have any lab test that is abnormal or we need to change your treatment, we will call you to review the results.  Testing/Procedures:   Your cardiac CT will be scheduled at one of the below locations:    [facility] [address]  [place], [place] [address] [phone]  All radiology patients and guests should use entrance C2 at [facility], accessed from [facility], even though the hospital's physical address listed is [address].  If scheduled at the Heart and Vascular Tower at [address], please enter the parking lot using the [address] entrance and use the FREE valet service at the patient drop-off area. Enter the building and check-in with registration on the main floor.    Please follow these instructions carefully (unless otherwise directed):  An IV will be required for this test and Nitroglycerin will be given.  Hold all erectile dysfunction medications at least 3 days (72 hrs) prior to test. (Ie viagra, cialis, sildenafil, tadalafil, etc)   On the Night  Before the Test: Be sure to Drink plenty of water. Do not consume any caffeinated/decaffeinated beverages or chocolate 12 hours prior to your test. Do not take any antihistamines 12 hours prior to your test.  On the Day of the Test: Drink plenty of water until 1 hour prior to the test. Do not eat any food 1 hour prior to test. You may take your regular medications prior to the test.  Take your Coreg  6.25 mg  two hours prior to test. If you take Furosemide/Hydrochlorothiazide /Spironolactone/Chlorthalidone, please HOLD on the morning of the  test. Patients who wear a continuous glucose monitor MUST remove the device prior to scanning. FEMALES- please wear underwire-free bra if available, avoid dresses & tight clothing  After the Test: Drink plenty of water. After receiving IV contrast, you may experience a mild flushed feeling. This is normal. On occasion, you may experience a mild rash up to 24 hours after the test. This is not dangerous. If this occurs, you can take Benadryl 25 mg, Zyrtec, Claritin, or Allegra and increase your fluid intake. (Patients taking Tikosyn should avoid Benadryl, and may take Zyrtec, Claritin, or Allegra) If you experience trouble breathing, this can be serious. If it is severe call 911 IMMEDIATELY. If it is mild, please call our office.  We will call to schedule your test 2-4 weeks out understanding that some insurance companies will need an authorization prior to the service being performed.   For more information and frequently asked questions, please visit our website : [email or web address]  For non-scheduling related questions, please contact the cardiac imaging nurse navigator should you have any questions/concerns: Cardiac Imaging Nurse Navigators Direct Office Dial: [phone]   For scheduling needs, including cancellations and rescheduling, please call [place], [phone].   Follow-Up: At [facility], you and your health  needs are our priority.  As part of our continuing mission to provide you with exceptional heart care, our providers are all part of one team.  This team includes your primary Cardiologist (physician) and Advanced Practice Providers or APPs (Physician Assistants and Nurse Practitioners) who all work together to provide you with the care you need, when you need it.  Your next appointment:   12/29 @11 :00  Provider:   Dr. Kate  We recommend signing up for the patient portal called MyChart.  Sign up information is provided on this After Visit Summary.  MyChart is used to connect with patients for Virtual Visits (Telemedicine).  Patients are able to view lab/test results, encounter notes, upcoming appointments, etc.  Non-urgent messages can be sent to your provider as well.   To learn more about what you can do with MyChart, go to [email or web address].   Other Instructions Please check blood twice a day for next week and send those reading           Signed, Lonni LITTIE Kate, MD  01/31/2024 12:28 PM    [facility]

## 2024-01-29 ENCOUNTER — Telehealth: Payer: Self-pay

## 2024-01-29 NOTE — Telephone Encounter (Signed)
 Patient was recently seen by Dr. Kate on 01/17/2024 at which time he mentioned need for hip replacement. Lexiscan  Myoview  was ordered for further evaluation of reduced EF to rule out ischemia prior to procedure. Myoview  showed a reversible defect in the apical to mid inferior and inferolateral locations suggesting possible ischemia vs variations in diaphragmatic attenuation artifact. Patient is scheduled to follow back up with Dr. Kate on 01/31/2024. Will defer completion of pre-op risk assessment to him.   Will route pre-op clearance to Dr. Kate so that he has it at upcoming visit.  Frederick Harris E Frederick Kanaan, PA-C 01/29/2024 2:13 PM

## 2024-01-29 NOTE — Telephone Encounter (Signed)
   Pre-operative Risk Assessment    Patient Name: Frederick Harris  DOB: 03/07/1951 MRN: [id number]   Date of last office visit: 01/17/24 LONNI NANAS, MD Date of next office visit: 01/31/24 LONNI NANAS, MD   Request for Surgical Clearance    Procedure:  RIGHT TOTAL HIP ARTHROPLASTY  Date of Surgery:  Clearance TBD                                Surgeon:  LONNI POLI, MD Surgeon's Group or Practice Name:  [facility] Phone number:  [phone] Fax number:  [phone]  ATTN: SHERRIE   Type of Clearance Requested:   - Medical  - Pharmacy:  Hold Apixaban  (Eliquis ) 3 DAYS PREOP   Type of Anesthesia:  Spinal   Additional requests/questions:    SignedLucie DELENA Ku   01/29/2024, 1:44 PM

## 2024-01-31 ENCOUNTER — Ambulatory Visit: Attending: Cardiology | Admitting: Cardiology

## 2024-01-31 ENCOUNTER — Encounter: Payer: Self-pay | Admitting: Cardiology

## 2024-01-31 ENCOUNTER — Other Ambulatory Visit: Payer: Self-pay

## 2024-01-31 VITALS — BP 164/62 | HR 60 | Ht 70.0 in | Wt 211.6 lb

## 2024-01-31 DIAGNOSIS — R9439 Abnormal result of other cardiovascular function study: Secondary | ICD-10-CM | POA: Diagnosis present

## 2024-01-31 DIAGNOSIS — I2699 Other pulmonary embolism without acute cor pulmonale: Secondary | ICD-10-CM | POA: Insufficient documentation

## 2024-01-31 DIAGNOSIS — Z01818 Encounter for other preprocedural examination: Secondary | ICD-10-CM | POA: Insufficient documentation

## 2024-01-31 DIAGNOSIS — I351 Nonrheumatic aortic (valve) insufficiency: Secondary | ICD-10-CM | POA: Diagnosis present

## 2024-01-31 DIAGNOSIS — I10 Essential (primary) hypertension: Secondary | ICD-10-CM | POA: Diagnosis present

## 2024-01-31 DIAGNOSIS — I5022 Chronic systolic (congestive) heart failure: Secondary | ICD-10-CM | POA: Insufficient documentation

## 2024-01-31 NOTE — Patient Instructions (Addendum)
 Medication Instructions:  Continue current medications *If you need a refill on your cardiac medications before your next appointment, please call your pharmacy*  Lab Work: Bmet today If you have labs (blood work) drawn today and your tests are completely normal, you will receive your results only by: MyChart Message (if you have MyChart) OR A paper copy in the mail If you have any lab test that is abnormal or we need to change your treatment, we will call you to review the results.  Testing/Procedures:   Your cardiac CT will be scheduled at one of the below locations:    [facility] [address]  [place], [place] [address] [phone]  All radiology patients and guests should use entrance C2 at [facility], accessed from [facility], even though the hospital's physical address listed is [address].  If scheduled at the Heart and V[facility] Tower at [address], please enter the parking lot using the [address] entrance and use the FREE valet service at the patient drop-off area. Enter the building and check-in with registration on the main floor.    Please follow these instructions carefully (unless otherwise directed):  An IV will be required for this test and Nitroglycerin will be given.  Hold all erectile dysfunction medications at least 3 days (72 hrs) prior to test. (Ie viagra, cialis, sildenafil, tadalafil, etc)   On the Night Before the Test: Be sure to Drink plenty of water. Do not consume any caffeinated/decaffeinated beverages or chocolate 12 hours prior to your test. Do not take any antihistamines 12 hours prior to your test.  On the Day of the Test: Drink plenty of water until 1 hour prior to the test. Do not eat any food 1 hour prior to test. You may take your regular medications prior to the test.  Take your Coreg  6.25 mg  two hours prior to test. If you take  Furosemide/Hydrochlorothiazide /Spironolactone/Chlorthalidone, please HOLD on the morning of the test. Patients who wear a continuous glucose monitor MUST remove the device prior to scanning. FEMALES- please wear underwire-free bra if available, avoid dresses & tight clothing  After the Test: Drink plenty of water. After receiving IV contrast, you may experience a mild flushed feeling. This is normal. On occasion, you may experience a mild rash up to 24 hours after the test. This is not dangerous. If this occurs, you can take Benadryl 25 mg, Zyrtec, Claritin, or Allegra and increase your fluid intake. (Patients taking Tikosyn should avoid Benadryl, and may take Zyrtec, Claritin, or Allegra) If you experience trouble breathing, this can be serious. If it is severe call 911 IMMEDIATELY. If it is mild, please call our office.  We will call to schedule your test 2-4 weeks out understanding that some insurance companies will need an authorization prior to the service being performed.   For more information and frequently asked questions, please visit our website : [email or web address]  For non-scheduling related questions, please contact the cardiac imaging nurse navigator should you have any questions/concerns: Cardiac Imaging Nurse Navigators Direct Office Dial: [phone]   For scheduling needs, including cancellations and rescheduling, please call [place], [phone].   Follow-Up: At [facility], you and your health needs are our priority.  As part of our continuing mission to provide you with exceptional heart care, our providers are all part of one team.  This team includes your primary Cardiologist (physician) and Advanced Practice Providers or APPs (Physician Assistants and  Nurse Practitioners) who all work together to provide you with the care you need, when you need it.  Your next appointment:   12/29 @11 :00  Provider:   Dr. Kate  We recommend signing  up for the patient portal called MyChart.  Sign up information is provided on this After Visit Summary.  MyChart is used to connect with patients for Virtual Visits (Telemedicine).  Patients are able to view lab/test results, encounter notes, upcoming appointments, etc.  Non-urgent messages can be sent to your provider as well.   To learn more about what you can do with MyChart, go to [email or web address].   Other Instructions Please check blood twice a day for next week and send those reading

## 2024-02-01 ENCOUNTER — Ambulatory Visit: Admitting: Orthopaedic Surgery

## 2024-02-01 ENCOUNTER — Ambulatory Visit: Payer: Self-pay | Admitting: Cardiology

## 2024-02-01 LAB — BASIC METABOLIC PANEL WITH GFR
BUN/Creatinine Ratio: 13 (ref 10–24)
BUN: 12 mg/dL (ref 8–27)
CO2: 21 mmol/L (ref 20–29)
Calcium: 9.2 mg/dL (ref 8.6–10.2)
Chloride: 102 mmol/L (ref 96–106)
Creatinine, Ser: 0.9 mg/dL (ref 0.76–1.27)
Glucose: 110 mg/dL — ABNORMAL HIGH (ref 70–99)
Potassium: 4.4 mmol/L (ref 3.5–5.2)
Sodium: 137 mmol/L (ref 134–144)
eGFR: 91 mL/min/1.73 (ref >59)

## 2024-02-05 ENCOUNTER — Telehealth (HOSPITAL_COMMUNITY): Payer: Self-pay | Admitting: Emergency Medicine

## 2024-02-05 ENCOUNTER — Other Ambulatory Visit (HOSPITAL_COMMUNITY): Payer: Self-pay

## 2024-02-05 NOTE — Telephone Encounter (Signed)
 Attempted to call patient regarding upcoming cardiac CT appointment. Left message on voicemail with name and callback number Rockwell Alexandria RN Navigator Cardiac Imaging [facility] [phone] Office [phone] Cell

## 2024-02-06 ENCOUNTER — Ambulatory Visit (HOSPITAL_BASED_OUTPATIENT_CLINIC_OR_DEPARTMENT_OTHER)
Admission: RE | Admit: 2024-02-06 | Discharge: 2024-02-06 | Disposition: A | Discharge status: Home / Self Care | Source: Ambulatory Visit | Attending: Cardiology | Admitting: Cardiology

## 2024-02-06 DIAGNOSIS — I5022 Chronic systolic (congestive) heart failure: Secondary | ICD-10-CM | POA: Insufficient documentation

## 2024-02-06 DIAGNOSIS — R9439 Abnormal result of other cardiovascular function study: Secondary | ICD-10-CM | POA: Diagnosis present

## 2024-02-06 MED ORDER — IOHEXOL 350 MG/ML SOLN
100.0000 mL | Freq: Once | INTRAVENOUS | Status: AC | PRN
  Administered 2024-02-06: 95 mL via INTRAVENOUS

## 2024-02-06 MED ORDER — NITROGLYCERIN 0.4 MG SL SUBL
0.8000 mg | SUBLINGUAL_TABLET | Freq: Once | SUBLINGUAL | Status: AC
  Administered 2024-02-06: 0.8 mg via SUBLINGUAL

## 2024-02-14 ENCOUNTER — Other Ambulatory Visit: Payer: Self-pay | Admitting: Physician Assistant

## 2024-02-14 DIAGNOSIS — Z01818 Encounter for other preprocedural examination: Secondary | ICD-10-CM

## 2024-02-22 NOTE — Progress Notes (Signed)
 Surgical Instructions   Your procedure is scheduled on Tuesday, October 21st, 2025. Report to [facility] Main Entrance A at 8:00 A.M., then check in with the Admitting office. Any questions or running late day of surgery: call [phone]  Questions prior to your surgery date: call [phone], Monday-Friday, 8am-4pm. If you experience any cold or flu symptoms such as cough, fever, chills, shortness of breath, etc. between now and your scheduled surgery, please notify us  at the above number.     Remember:  Do not eat after midnight the night before your surgery  You may drink clear liquids until 7:00 the morning of your surgery.   Clear liquids allowed are: Water, Non-Citrus Juices (without pulp), Carbonated Beverages, Clear Tea (no milk, honey, etc.), Black Coffee Only (NO MILK, CREAM OR POWDERED CREAMER of any kind), and Gatorade.  Patient Instructions  The night before surgery:  No food after midnight. ONLY clear liquids after midnight   The day of surgery (if you have diabetes): Drink ONE (1) 12 oz G2 given to you in your pre admission testing appointment by 7:00 the morning of surgery. Drink in one sitting. Do not sip.  This drink was given to you during your hospital  pre-op appointment visit.  Nothing else to drink after completing the  12 oz bottle of G2.         If you have questions, please contact your surgeon's office.     Take these medicines the morning of surgery with A SIP OF WATER: Atorvastatin (Lipitor) Carvedilol  (Coreg )   May take these medicines IF NEEDED: None.    Per your surgeon's instructions, Apixaban  (Eliquis ) should be stopped 3 days prior to surgery.  Your last dose of Apixaban  (Eliquis ) should be on Friday, October 17th.    One week prior to surgery, STOP taking any Aspirin (unless otherwise instructed by your surgeon) Aleve, Naproxen, Ibuprofen, Motrin, Advil, Goody's, BC's, all herbal medications, fish oil, and non-prescription  vitamins.   WHAT DO I DO ABOUT MY DIABETES MEDICATION?   Do not take Metformin  (Glucophage ) the morning of surgery.      HOW TO MANAGE YOUR DIABETES BEFORE AND AFTER SURGERY  Why is it important to control my blood sugar before and after surgery? Improving blood sugar levels before and after surgery helps healing and can limit problems. A way of improving blood sugar control is eating a healthy diet by:  Eating less sugar and carbohydrates  Increasing activity/exercise  Talking with your doctor about reaching your blood sugar goals High blood sugars (greater than 180 mg/dL) can raise your risk of infections and slow your recovery, so you will need to focus on controlling your diabetes during the weeks before surgery. Make sure that the doctor who takes care of your diabetes knows about your planned surgery including the date and location.  How do I manage my blood sugar before surgery? Check your blood sugar at least 4 times a day, starting 2 days before surgery, to make sure that the level is not too high or low.  Check your blood sugar the morning of your surgery when you wake up and every 2 hours until you get to the Short Stay unit.  If your blood sugar is less than 70 mg/dL, you will need to treat for low blood sugar: Do not take insulin . Treat a low blood sugar (less than 70 mg/dL) with  cup of clear juice (cranberry or apple), 4 glucose tablets, OR glucose gel. Recheck blood sugar in  15 minutes after treatment (to make sure it is greater than 70 mg/dL). If your blood sugar is not greater than 70 mg/dL on recheck, call [phone] for further instructions. Report your blood sugar to the short stay nurse when you get to Short Stay.  If you are admitted to the hospital after surgery: Your blood sugar will be checked by the staff and you will probably be given insulin  after surgery (instead of oral diabetes medicines) to make sure you have good blood sugar levels. The goal  for blood sugar control after surgery is 80-180 mg/dL.                      Do NOT Smoke (Tobacco/Vaping) for 24 hours prior to your procedure.  If you use a CPAP at night, you may bring your mask/headgear for your overnight stay.   You will be asked to remove any contacts, glasses, piercing's, hearing aid's, dentures/partials prior to surgery. Please bring cases for these items if needed.    Patients discharged the day of surgery will not be allowed to drive home, and someone needs to stay with them for 24 hours.  SURGICAL WAITING ROOM VISITATION Patients may have no more than 2 support people in the waiting area - these visitors may rotate.   Pre-op nurse will coordinate an appropriate time for 1 ADULT support person, who may not rotate, to accompany patient in pre-op.  Children under the age of 34 must have an adult with them who is not the patient and must remain in the main waiting area with an adult.  If the patient needs to stay at the hospital during part of their recovery, the visitor guidelines for inpatient rooms apply.  Please refer to the [facility] website for the visitor guidelines for any additional information.   If you received a COVID test during your pre-op visit  it is requested that you wear a mask when out in public, stay away from anyone that may not be feeling well and notify your surgeon if you develop symptoms. If you have been in contact with anyone that has tested positive in the last 10 days please notify you surgeon.      Pre-operative 4 CHG Bathing Instructions   You can play a key role in reducing the risk of infection after surgery. Your skin needs to be as free of germs as possible. You can reduce the number of germs on your skin by washing with CHG (chlorhexidine gluconate) soap before surgery. CHG is an antiseptic soap that kills germs and continues to kill germs even after washing.   DO NOT use if you have an allergy to chlorhexidine/CHG or  antibacterial soaps. If your skin becomes reddened or irritated, stop using the CHG and notify one of our RNs at [phone].   Please shower with the CHG soap starting 4 days before surgery using the following schedule:     Please keep in mind the following:  DO NOT shave, including legs and underarms, starting the day of your first shower.   You may shave your face at any point before/day of surgery.  Place clean sheets on your bed the day you start using CHG soap. Use a clean washcloth (not used since being washed) for each shower. DO NOT sleep with pets once you start using the CHG.   CHG Shower Instructions:  Wash your face and private area with normal soap. If you choose to wash your hair, wash first  with your normal shampoo.  After you use shampoo/soap, rinse your hair and body thoroughly to remove shampoo/soap residue.  Turn the water OFF and apply  bottle of CHG soap to a CLEAN washcloth.  Apply CHG soap ONLY FROM YOUR NECK DOWN TO YOUR TOES (washing for 3-5 minutes)  DO NOT use CHG soap on face, private areas, open wounds, or sores.  Pay special attention to the area where your surgery is being performed.  If you are having back surgery, having someone wash your back for you may be helpful. Wait 2 minutes after CHG soap is applied, then you may rinse off the CHG soap.  Pat dry with a clean towel  Put on clean clothes/pajamas   If you choose to wear lotion, please use ONLY the CHG-compatible lotions that are listed below.  Additional instructions for the day of surgery:  If you choose, you may shower the morning of surgery with an antibacterial soap.  DO NOT APPLY any lotions, deodorants, cologne, or perfumes.   Do not bring valuables to the hospital. [facility] is not responsible for any belongings/valuables. Do not wear nail polish, gel polish, artificial nails, or any other type of covering on natural nails (fingers and toes) Do not wear jewelry or makeup Put on  clean/comfortable clothes.  Please brush your teeth.  Ask your nurse before applying any prescription medications to the skin.     CHG Compatible Lotions   Aveeno Moisturizing lotion  Cetaphil Moisturizing Cream  Cetaphil Moisturizing Lotion  Clairol Herbal Essence Moisturizing Lotion, Dry Skin  Clairol Herbal Essence Moisturizing Lotion, Extra Dry Skin  Clairol Herbal Essence Moisturizing Lotion, Normal Skin  Curel Age Defying Therapeutic Moisturizing Lotion with Alpha Hydroxy  Curel Extreme Care Body Lotion  Curel Soothing Hands Moisturizing Hand Lotion  Curel Therapeutic Moisturizing Cream, Fragrance-Free  Curel Therapeutic Moisturizing Lotion, Fragrance-Free  Curel Therapeutic Moisturizing Lotion, Original Formula  Eucerin Daily Replenishing Lotion  Eucerin Dry Skin Therapy Plus Alpha Hydroxy Crme  Eucerin Dry Skin Therapy Plus Alpha Hydroxy Lotion  Eucerin Original Crme  Eucerin Original Lotion  Eucerin Plus Crme Eucerin Plus Lotion  Eucerin TriLipid Replenishing Lotion  Keri Anti-Bacterial Hand Lotion  Keri Deep Conditioning Original Lotion Dry Skin Formula Softly Scented  Keri Deep Conditioning Original Lotion, Fragrance Free Sensitive Skin Formula  Keri Lotion Fast Absorbing Fragrance Free Sensitive Skin Formula  Keri Lotion Fast Absorbing Softly Scented Dry Skin Formula  Keri Original Lotion  Keri Skin Renewal Lotion Keri Silky Smooth Lotion  Keri Silky Smooth Sensitive Skin Lotion  Nivea Body Creamy Conditioning Oil  Nivea Body Extra Enriched Lotion  Nivea Body Original Lotion  Nivea Body Sheer Moisturizing Lotion Nivea Crme  Nivea Skin Firming Lotion  NutraDerm 30 Skin Lotion  NutraDerm Skin Lotion  NutraDerm Therapeutic Skin Cream  NutraDerm Therapeutic Skin Lotion  ProShield Protective Hand Cream  Provon moisturizing lotion  Please read over the following fact sheets that you were given.

## 2024-02-23 ENCOUNTER — Other Ambulatory Visit: Payer: Self-pay

## 2024-02-23 ENCOUNTER — Encounter (HOSPITAL_COMMUNITY): Payer: Self-pay

## 2024-02-23 ENCOUNTER — Encounter (HOSPITAL_COMMUNITY)
Admission: RE | Admit: 2024-02-23 | Discharge: 2024-02-23 | Disposition: A | Discharge status: Home / Self Care | Source: Ambulatory Visit | Attending: Orthopaedic Surgery | Admitting: Orthopaedic Surgery

## 2024-02-23 VITALS — BP 164/65 | HR 60 | Temp 98.5°F | Resp 17 | Ht 70.0 in | Wt 207.0 lb

## 2024-02-23 DIAGNOSIS — Z86718 Personal history of other venous thrombosis and embolism: Secondary | ICD-10-CM | POA: Diagnosis not present

## 2024-02-23 DIAGNOSIS — E119 Type 2 diabetes mellitus without complications: Secondary | ICD-10-CM | POA: Insufficient documentation

## 2024-02-23 DIAGNOSIS — I5020 Unspecified systolic (congestive) heart failure: Secondary | ICD-10-CM | POA: Insufficient documentation

## 2024-02-23 DIAGNOSIS — Z86711 Personal history of pulmonary embolism: Secondary | ICD-10-CM | POA: Insufficient documentation

## 2024-02-23 DIAGNOSIS — E785 Hyperlipidemia, unspecified: Secondary | ICD-10-CM | POA: Diagnosis not present

## 2024-02-23 DIAGNOSIS — I110 Hypertensive heart disease with heart failure: Secondary | ICD-10-CM | POA: Diagnosis not present

## 2024-02-23 DIAGNOSIS — Z01818 Encounter for other preprocedural examination: Secondary | ICD-10-CM

## 2024-02-23 DIAGNOSIS — I2510 Atherosclerotic heart disease of native coronary artery without angina pectoris: Secondary | ICD-10-CM | POA: Diagnosis not present

## 2024-02-23 DIAGNOSIS — Z01812 Encounter for preprocedural laboratory examination: Secondary | ICD-10-CM | POA: Diagnosis present

## 2024-02-23 DIAGNOSIS — Z7901 Long term (current) use of anticoagulants: Secondary | ICD-10-CM | POA: Diagnosis not present

## 2024-02-23 DIAGNOSIS — D649 Anemia, unspecified: Secondary | ICD-10-CM | POA: Diagnosis not present

## 2024-02-23 LAB — HEMOGLOBIN A1C
Hgb A1c MFr Bld: 6.6 % — ABNORMAL HIGH (ref 4.8–5.6)
Mean Plasma Glucose: 142.72 mg/dL

## 2024-02-23 LAB — SURGICAL PCR SCREEN
MRSA, PCR: NEGATIVE
Staphylococcus aureus: NEGATIVE

## 2024-02-23 LAB — BASIC METABOLIC PANEL WITH GFR
Anion gap: 10 (ref 5–15)
BUN: 15 mg/dL (ref 8–23)
CO2: 21 mmol/L — ABNORMAL LOW (ref 22–32)
Calcium: 9 mg/dL (ref 8.9–10.3)
Chloride: 104 mmol/L (ref 98–111)
Creatinine, Ser: 0.92 mg/dL (ref 0.61–1.24)
GFR, Estimated: >60 mL/min (ref >60)
Glucose, Bld: 111 mg/dL — ABNORMAL HIGH (ref 70–99)
Potassium: 4 mmol/L (ref 3.5–5.1)
Sodium: 135 mmol/L (ref 135–145)

## 2024-02-23 LAB — TYPE AND SCREEN
ABO/RH(D): B POS
Antibody Screen: NEGATIVE

## 2024-02-23 LAB — CBC
HCT: 38 % — ABNORMAL LOW (ref 39.0–52.0)
Hemoglobin: 12.6 g/dL — ABNORMAL LOW (ref 13.0–17.0)
MCH: 28.2 pg (ref 26.0–34.0)
MCHC: 33.2 g/dL (ref 30.0–36.0)
MCV: 85 fL (ref 80.0–100.0)
Platelets: 246 K/uL (ref 150–400)
RBC: 4.47 MIL/uL (ref 4.22–5.81)
RDW: 13.2 % (ref 11.5–15.5)
WBC: 5.8 K/uL (ref 4.0–10.5)
nRBC: 0 % (ref 0.0–0.2)

## 2024-02-23 LAB — GLUCOSE, CAPILLARY: Glucose-Capillary: 106 mg/dL — ABNORMAL HIGH (ref 70–99)

## 2024-02-23 NOTE — Progress Notes (Signed)
 PCP - Frederick Harris Cardiologist - Frederick Harris - clearance on 01/31/24  PPM/ICD - denies Device Orders -  Rep Notified -   Chest x-ray - 07/05/23 EKG - 01/31/24 Stress Test - 01/23/24 ECHO - 11/03/23 Cardiac Cath - deneis  Sleep Study - denies CPAP - no  Fasting Blood Sugar - 116 Checks Blood Sugar one time a day  Last dose of GLP1 agonist-  na GLP1 instructions: na  Blood Thinner Instructions:   Per your surgeon's instructions, Apixaban  (Eliquis ) should be stopped 3 days prior to surgery.  Your last dose of Apixaban  (Eliquis ) should be on Friday, October 17th.  Aspirin Instructions:na  ERAS Protcol -clear liquids until 0700 PRE-SURGERY Ensure or G2- G2  COVID TEST- na   Anesthesia review: yes- cardiac clearance 01/31/24-hx PE,DVT,HTN,HLD  Patient denies shortness of breath, fever, cough and chest pain at PAT appointment   All instructions explained to the patient, with a verbal understanding of the material. Patient agrees to go over the instructions while at home for a better understanding. he opportunity to ask questions was provided.

## 2024-02-26 NOTE — Progress Notes (Signed)
 Anesthesia Chart Review:  73 year old male follows with cardiology for history of PE/DVT, HTN, HLD, HFmrEF. He was admitted on 07/05/2023, found to have acute right lower lobe subsegmental PE along with acute left lower extremity DVT.  He was started on Eliquis .  Echocardiogram 07/05/2023 showed EF 45 to 50%, normal RV function, mild RV enlargement, mild to moderate aortic regurgitation.   Echocardiogram 10/2023 showed EF 50 to 55%, normal RV function, moderate aortic regurgitation.  Cardiac MRI 01/04/2024 showed LVEF 45%, RVEF 42%, basal septal mid wall LGE, RV insertion site LGE, moderate to severe aortic regurgitation (35% regurgitant fraction), moderate pulmonic regurgitation (regurgitant fraction 24%).  Lexiscan  Myoview  01/23/2024 showed reversible perfusion defect in inferior wall concerning for possible ischemia, LVEF 45%; findings concerning for possible ischemia versus artifact; no coronary calcifications on CT. seen by Dr. Kate 01/31/2024 for preop evaluation.  Per note, Preop evaluation: Prior to hip replacement.  He denies any exertional chest pain or dyspnea.  However given his systolic dysfunction as above, recommended Lexiscan  Myoview  to rule out ischemia prior to his procedure.  Lexiscan  Myoview  showed possible inferior ischemia, plan coronary CTA for further evaluation as above.  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  Dr. Kate commented on results stating, Small amount of plaque in heart arteries but no blockages. OK from heart standpoint to proceed with hip replacement.  Patient reports last dose Eliquis  02/23/2024.  Other pertinent history includes non-insulin -dependent DM2.  Preop labs reviewed, mild anemia hemoglobin 12.6, otherwise unremarkable.  DM2 well-controlled with A1c 6.6.  EKG 01/31/2024: NSR.  Rate 60.  Coronary CTA 02/06/2024: IMPRESSION: 1. Coronary calcium score of 23. This was 10 percentile for age and sex matched control.   2. Normal  coronary origin with right dominance.   3. CAD-RADS 2. 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 Consider non-atherosclerotic causes of chest pain. Consider preventive therapy and risk factor modification.  Cardiac MRI 01/03/2024: IMPRESSION: 1. Normal LV size, mild hypertrophy, and mild systolic dysfunction (EF 45%)   2.  Normal RV size with mild systolic dysfunction (EF 42%)   3. Basal septal midwall LGE, which is a scar pattern seen in nonischemic cardiomyopathies and associated with worse prognosis   4. RV insertion site LGE, which is a nonspecific scar pattern often seen in setting of elevated pulmonary pressures   5. Moderate to severe aortic regurgitation (regurgitant fraction 35%, which is at the cutoff between moderate and severe AI for cardiac MRI)   6.  Moderate pulmonic regurgitation (regurgitant fraction 24%)      Lynwood Geofm RIGGERS [facility] Short Stay Center/Anesthesiology Phone [phone] 02/26/2024 10:58 AM

## 2024-02-26 NOTE — H&P (Signed)
 TOTAL HIP ADMISSION H&P  Patient is admitted for right total hip arthroplasty.  Subjective:  Chief Complaint: right hip pain  HPI: Frederick Harris, 73 y.o. male, has a history of pain and functional disability in the right hip(s) due to arthritis and patient has failed non-surgical conservative treatments for greater than 12 weeks to include NSAID's and/or analgesics, corticosteriod injections, flexibility and strengthening excercises, use of assistive devices, weight reduction as appropriate, and activity modification.  Onset of symptoms was gradual starting several years ago with gradually worsening course since that time.The patient noted no past surgery on the right hip(s).  Patient currently rates pain in the right hip at 10 out of 10 with activity. Patient has night pain, worsening of pain with activity and weight bearing, trendelenberg gait, pain that interfers with activities of daily living, pain with passive range of motion, and crepitus. Patient has evidence of subchondral sclerosis, periarticular osteophytes, and joint space narrowing by imaging studies. This condition presents safety issues increasing the risk of falls.  There is no current active infection.  Patient Active Problem List   Diagnosis Date Noted   Chronic heart failure with mildly reduced ejection fraction (HFmrEF, 41-49%) (HCC) 08/01/2023   Acute pulmonary embolism (HCC) 07/05/2023   HTN (hypertension) 07/05/2023   Nuclear sclerotic cataract of left eye 02/03/2021   Peripheral pterygium, stationary, left 01/28/2020   Macular hole of right eye 01/28/2020   Retinal detachment of right eye with single break 01/28/2020   Posterior vitreous detachment of left eye 01/28/2020   Diabetes mellitus without complication (HCC) 01/28/2020   Unilateral primary osteoarthritis, right hip 08/21/2019   Unilateral primary osteoarthritis, left hip 03/09/2017   Past Medical History:  Diagnosis Date   CHF (congestive heart failure) (HCC)     Diabetes mellitus without complication (HCC)    Hypertension    Pulmonary embolism (HCC) 07/05/2023    Past Surgical History:  Procedure Laterality Date   EYE SURGERY Right    HAND TENDON SURGERY Right    middle finger    No current facility-administered medications for this encounter.   Current Outpatient Medications  Medication Sig Dispense Refill Last Dose/Taking   apixaban  (ELIQUIS ) 5 MG TABS tablet Take 1 tablet (5 mg total) by mouth 2 (two) times daily. 60 tablet 4 Taking   atorvastatin (LIPITOR) 20 MG tablet Take 20 mg by mouth daily.   Taking   carvedilol  (COREG ) 6.25 MG tablet Take 1 tablet (6.25 mg total) by mouth 2 (two) times daily with a meal. 180 tablet 2 Taking   Cholecalciferol (VITAMIN D3) LIQD Take 5 drops by mouth daily.   Taking   Cyanocobalamin (VITAMIN B-12 PO) Take 1 tablet by mouth daily.   Taking   losartan -hydrochlorothiazide  (HYZAAR) 100-25 MG tablet Take 1 tablet by mouth at bedtime.   Taking   MAGNESIUM PO Take 1 tablet by mouth daily.   Taking   metFORMIN  (GLUCOPHAGE -XR) 500 MG 24 hr tablet Take 500 mg by mouth daily. Per patient he does not take daily, not taken in a while   Taking   polyvinyl alcohol  (LIQUIFILM TEARS) 1.4 % ophthalmic solution Place 1 drop into both eyes as needed for dry eyes.   Taking As Needed   Allergies  Allergen Reactions   Iodine Other (See Comments)    Pt stated contrast dye and betadine do not cause a reaction.    Shellfish Allergy Hives, Itching and Swelling    Social History   Tobacco Use   Smoking status:  Never   Smokeless tobacco: Never  Substance Use Topics   Alcohol  use: Never    Family History  Problem Relation Age of Onset   Heart attack Mother    Heart attack Father    Clotting disorder Sister    Clotting disorder Brother      Review of Systems  Objective:  Physical Exam Vitals reviewed.  Constitutional:      Appearance: Normal appearance. He is normal weight.  HENT:     Head: Normocephalic  and atraumatic.  Eyes:     Extraocular Movements: Extraocular movements intact.     Pupils: Pupils are equal, round, and reactive to light.  Cardiovascular:     Rate and Rhythm: Normal rate and regular rhythm.  Pulmonary:     Effort: Pulmonary effort is normal.     Breath sounds: Normal breath sounds.  Abdominal:     Palpations: Abdomen is soft.  Musculoskeletal:     Cervical back: Normal range of motion and neck supple.     Right hip: Tenderness and bony tenderness present. Decreased range of motion. Decreased strength.  Neurological:     Mental Status: He is alert and oriented to person, place, and time.  Psychiatric:        Behavior: Behavior normal.     Vital signs in last 24 hours:    Labs:   Estimated body mass index is 29.7 kg/m as calculated from the following:   Height as of 02/23/24: 5' 10 (1.778 m).   Weight as of 02/23/24: 93.9 kg.   Imaging Review Plain radiographs demonstrate severe degenerative joint disease of the right hip(s). The bone quality appears to be excellent for age and reported activity level.      Assessment/Plan:  End stage arthritis, right hip(s)  The patient history, physical examination, clinical judgement of the provider and imaging studies are consistent with end stage degenerative joint disease of the right hip(s) and total hip arthroplasty is deemed medically necessary. The treatment options including medical management, injection therapy, arthroscopy and arthroplasty were discussed at length. The risks and benefits of total hip arthroplasty were presented and reviewed. The risks due to aseptic loosening, infection, stiffness, dislocation/subluxation,  thromboembolic complications and other imponderables were discussed.  The patient acknowledged the explanation, agreed to proceed with the plan and consent was signed. Patient is being admitted for inpatient treatment for surgery, pain control, PT, OT, prophylactic antibiotics, VTE  prophylaxis, progressive ambulation and ADL's and discharge planning.The patient is planning to be discharged home with home health services

## 2024-02-26 NOTE — Anesthesia Preprocedure Evaluation (Signed)
 Anesthesia Evaluation  Patient identified by MRN, date of birth, ID band Patient awake    Reviewed: Allergy & Precautions, NPO status , Patient's Chart, lab work & pertinent test results, reviewed documented beta blocker date and time   History of Anesthesia Complications Negative for: history of anesthetic complications  Airway Mallampati: II  TM Distance: >3 FB     Dental no notable dental hx.    Pulmonary neg COPD, PE (subsegmental PE 2/25)   breath sounds clear to auscultation       Cardiovascular hypertension, +CHF  + Valvular Problems/Murmurs (mod-severe AI, mod PI) AI  Rhythm:Regular Rate:Normal  Cardiac MRI 01/04/2024 showed LVEF 45%, RVEF 42%, basal septal mid wall LGE, RV insertion site LGE, moderate to severe aortic regurgitation (35% regurgitant fraction), moderate pulmonic regurgitation (regurgitant fraction 24%).    IMPRESSIONS     1. Left ventricular ejection fraction, by estimation, is 50 to 55%. The  left ventricle has low normal function. The left ventricle has no regional  wall motion abnormalities. There is mild left ventricular hypertrophy.  Left ventricular diastolic  parameters are indeterminate.   2. Right ventricular systolic function is normal. The right ventricular  size is mildly enlarged. Tricuspid regurgitation signal is inadequate for  assessing PA pressure.   3. The mitral valve is normal in structure. Trivial mitral valve  regurgitation.   4. The aortic valve was not well visualized. Aortic valve regurgitation  is moderate. No aortic stenosis is present.   Comparison(s): EF 45%, global hypokinesis, GLE -10.6%, mild-moderate AI.     Neuro/Psych neg Seizures    GI/Hepatic ,neg GERD  ,,(+) neg Cirrhosis        Endo/Other  diabetes    Renal/GU      Musculoskeletal  (+) Arthritis ,    Abdominal   Peds  Hematology   Anesthesia Other Findings Eliquis  held 3d   Reproductive/Obstetrics                              Anesthesia Physical Anesthesia Plan  ASA: 3  Anesthesia Plan: Spinal   Post-op Pain Management:    Induction:   PONV Risk Score and Plan: 1 and Ondansetron and Propofol infusion  Airway Management Planned: Natural Airway and Simple Face Mask  Additional Equipment:   Intra-op Plan:   Post-operative Plan:   Informed Consent: I have reviewed the patients History and Physical, chart, labs and discussed the procedure including the risks, benefits and alternatives for the proposed anesthesia with the patient or authorized representative who has indicated his/her understanding and acceptance.     Dental advisory given  Plan Discussed with: CRNA  Anesthesia Plan Comments: (PAT note by Lynwood Hope, PA-C: 73 year old male follows with cardiology for history of PE/DVT, HTN, HLD, HFmrEF. He was admitted on 07/05/2023, found to have acute right lower lobe subsegmental PE along with acute left lower extremity DVT.  He was started on Eliquis .  Echocardiogram 07/05/2023 showed EF 45 to 50%, normal RV function, mild RV enlargement, mild to moderate aortic regurgitation.  Echocardiogram 10/2023 showed EF 50 to 55%, normal RV function, moderate aortic regurgitation.  Cardiac MRI 01/04/2024 showed LVEF 45%, RVEF 42%, basal septal mid wall LGE, RV insertion site LGE, moderate to severe aortic regurgitation (35% regurgitant fraction), moderate pulmonic regurgitation (regurgitant fraction 24%).  Lexiscan  Myoview  01/23/2024 showed reversible perfusion defect in inferior wall concerning for possible ischemia, LVEF 45%; findings concerning for possible ischemia versus artifact; no  coronary calcifications on CT. seen by Dr. Kate 01/31/2024 for preop evaluation.  Per note, Preop evaluation: Prior to hip replacement.  He denies any exertional chest pain or dyspnea.  However given his systolic dysfunction as above, recommended Lexiscan   Myoview  to rule out ischemia prior to his procedure.  Lexiscan  Myoview  showed possible inferior ischemia, plan coronary CTA for further evaluation as above.  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  Dr. Kate commented on results stating, Small amount of plaque in heart arteries but no blockages. OK from heart standpoint to proceed with hip replacement.  Patient reports last dose Eliquis  02/23/2024.  Other pertinent history includes non-insulin -dependent DM2.  Preop labs reviewed, mild anemia hemoglobin 12.6, otherwise unremarkable.  DM2 well-controlled with A1c 6.6.  EKG 01/31/2024: NSR.  Rate 60.  Coronary CTA 02/06/2024: IMPRESSION: 1. Coronary calcium score of 23. This was 10 percentile for age and sex matched control.  2. Normal coronary origin with right dominance.  3. CAD-RADS 2. 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 Consider non-atherosclerotic causes of chest pain. Consider preventive therapy and risk factor modification.  Cardiac MRI 01/03/2024: IMPRESSION: 1. Normal LV size, mild hypertrophy, and mild systolic dysfunction (EF 45%)  2.  Normal RV size with mild systolic dysfunction (EF 42%)  3. Basal septal midwall LGE, which is a scar pattern seen in nonischemic cardiomyopathies and associated with worse prognosis  4. RV insertion site LGE, which is a nonspecific scar pattern often seen in setting of elevated pulmonary pressures  5. Moderate to severe aortic regurgitation (regurgitant fraction 35%, which is at the cutoff between moderate and severe AI for cardiac MRI)  6.  Moderate pulmonic regurgitation (regurgitant fraction 24%)  )         Anesthesia Quick Evaluation

## 2024-02-27 ENCOUNTER — Other Ambulatory Visit: Payer: Self-pay

## 2024-02-27 ENCOUNTER — Encounter (HOSPITAL_COMMUNITY)
Admission: RE | Disposition: A | Discharge status: Home / Self Care | Payer: Self-pay | Source: Home / Self Care | Attending: Orthopaedic Surgery

## 2024-02-27 ENCOUNTER — Ambulatory Visit (HOSPITAL_COMMUNITY)

## 2024-02-27 ENCOUNTER — Ambulatory Visit (HOSPITAL_COMMUNITY): Admitting: Certified Registered"

## 2024-02-27 ENCOUNTER — Encounter (HOSPITAL_COMMUNITY): Payer: Self-pay | Admitting: Orthopaedic Surgery

## 2024-02-27 ENCOUNTER — Ambulatory Visit (HOSPITAL_COMMUNITY): Payer: Self-pay | Admitting: Physician Assistant

## 2024-02-27 ENCOUNTER — Observation Stay (HOSPITAL_COMMUNITY)
Admission: RE | Admit: 2024-02-27 | Discharge: 2024-02-28 | Disposition: A | Discharge status: Home / Self Care | Attending: Orthopaedic Surgery | Admitting: Orthopaedic Surgery

## 2024-02-27 ENCOUNTER — Observation Stay (HOSPITAL_COMMUNITY)

## 2024-02-27 DIAGNOSIS — I5021 Acute systolic (congestive) heart failure: Secondary | ICD-10-CM | POA: Diagnosis not present

## 2024-02-27 DIAGNOSIS — I5022 Chronic systolic (congestive) heart failure: Secondary | ICD-10-CM | POA: Diagnosis not present

## 2024-02-27 DIAGNOSIS — Z7901 Long term (current) use of anticoagulants: Secondary | ICD-10-CM | POA: Insufficient documentation

## 2024-02-27 DIAGNOSIS — Z79899 Other long term (current) drug therapy: Secondary | ICD-10-CM | POA: Insufficient documentation

## 2024-02-27 DIAGNOSIS — E119 Type 2 diabetes mellitus without complications: Secondary | ICD-10-CM | POA: Diagnosis not present

## 2024-02-27 DIAGNOSIS — M1611 Unilateral primary osteoarthritis, right hip: Secondary | ICD-10-CM

## 2024-02-27 DIAGNOSIS — Z96641 Presence of right artificial hip joint: Secondary | ICD-10-CM

## 2024-02-27 DIAGNOSIS — I110 Hypertensive heart disease with heart failure: Secondary | ICD-10-CM

## 2024-02-27 DIAGNOSIS — Z7984 Long term (current) use of oral hypoglycemic drugs: Secondary | ICD-10-CM | POA: Diagnosis not present

## 2024-02-27 DIAGNOSIS — Z01818 Encounter for other preprocedural examination: Secondary | ICD-10-CM

## 2024-02-27 DIAGNOSIS — M25551 Pain in right hip: Secondary | ICD-10-CM | POA: Diagnosis present

## 2024-02-27 HISTORY — PX: TOTAL HIP ARTHROPLASTY: SHX124

## 2024-02-27 LAB — GLUCOSE, CAPILLARY
Glucose-Capillary: 124 mg/dL — ABNORMAL HIGH (ref 70–99)
Glucose-Capillary: 108 mg/dL — ABNORMAL HIGH (ref 70–99)
Glucose-Capillary: 120 mg/dL — ABNORMAL HIGH (ref 70–99)
Glucose-Capillary: 105 mg/dL — ABNORMAL HIGH (ref 70–99)

## 2024-02-27 LAB — ABO/RH: ABO/RH(D): B POS

## 2024-02-27 SURGERY — ARTHROPLASTY, HIP, TOTAL, ANTERIOR APPROACH
Anesthesia: Spinal | Site: Hip | Laterality: Right

## 2024-02-27 MED ORDER — CHLORHEXIDINE GLUCONATE 0.12 % MT SOLN
15.0000 mL | Freq: Once | OROMUCOSAL | Status: AC
  Administered 2024-02-27: 15 mL via OROMUCOSAL
  Filled 2024-02-27: qty 15

## 2024-02-27 MED ORDER — PROPOFOL 500 MG/50ML IV EMUL
INTRAVENOUS | Status: DC | PRN
  Administered 2024-02-27: 50 ug/kg/min via INTRAVENOUS

## 2024-02-27 MED ORDER — HYDROCHLOROTHIAZIDE 25 MG PO TABS
25.0000 mg | ORAL_TABLET | Freq: Every day | ORAL | Status: DC
Start: 2024-02-27 — End: 2024-02-28
  Administered 2024-02-27: 25 mg via ORAL
  Filled 2024-02-27: qty 1

## 2024-02-27 MED ORDER — MIDAZOLAM HCL 2 MG/2ML IJ SOLN
INTRAMUSCULAR | Status: AC
  Filled 2024-02-27: qty 2

## 2024-02-27 MED ORDER — CARVEDILOL 6.25 MG PO TABS
6.2500 mg | ORAL_TABLET | Freq: Two times a day (BID) | ORAL | Status: DC
  Administered 2024-02-27 – 2024-02-28 (×2): 6.25 mg via ORAL
  Filled 2024-02-27 (×2): qty 1

## 2024-02-27 MED ORDER — ORAL CARE MOUTH RINSE: 15.0000 mL | Freq: Once | OROMUCOSAL | Status: AC

## 2024-02-27 MED ORDER — OXYCODONE HCL 5 MG PO TABS
10.0000 mg | ORAL_TABLET | ORAL | Status: DC | PRN
  Administered 2024-02-27: 10 mg via ORAL
  Administered 2024-02-28: 15 mg via ORAL
  Filled 2024-02-27: qty 3

## 2024-02-27 MED ORDER — PROPOFOL 10 MG/ML IV BOLUS
INTRAVENOUS | Status: DC | PRN
  Administered 2024-02-27 (×2): 20 mg via INTRAVENOUS

## 2024-02-27 MED ORDER — METOCLOPRAMIDE HCL 5 MG PO TABS: 5.0000 mg | ORAL_TABLET | Freq: Three times a day (TID) | ORAL | Status: DC | PRN

## 2024-02-27 MED ORDER — TRANEXAMIC ACID-NACL 1000-0.7 MG/100ML-% IV SOLN
1000.0000 mg | INTRAVENOUS | Status: AC
  Administered 2024-02-27: 1000 mg via INTRAVENOUS
  Filled 2024-02-27: qty 100

## 2024-02-27 MED ORDER — FENTANYL CITRATE (PF) 100 MCG/2ML IJ SOLN
INTRAMUSCULAR | Status: AC
  Filled 2024-02-27: qty 2

## 2024-02-27 MED ORDER — CEFAZOLIN SODIUM-DEXTROSE 2-4 GM/100ML-% IV SOLN
2.0000 g | Freq: Four times a day (QID) | INTRAVENOUS | Status: AC
  Administered 2024-02-27 (×2): 2 g via INTRAVENOUS
  Filled 2024-02-27 (×2): qty 100

## 2024-02-27 MED ORDER — PHENOL 1.4 % MT LIQD: 1.0000 | OROMUCOSAL | Status: DC | PRN

## 2024-02-27 MED ORDER — DIPHENHYDRAMINE HCL 12.5 MG/5ML PO ELIX: 12.5000 mg | ORAL_SOLUTION | ORAL | Status: DC | PRN

## 2024-02-27 MED ORDER — METOCLOPRAMIDE HCL 5 MG/ML IJ SOLN: 5.0000 mg | Freq: Three times a day (TID) | INTRAMUSCULAR | Status: DC | PRN

## 2024-02-27 MED ORDER — GLYCOPYRROLATE PF 0.2 MG/ML IJ SOSY
PREFILLED_SYRINGE | INTRAMUSCULAR | Status: DC | PRN
  Administered 2024-02-27: .2 mg via INTRAVENOUS

## 2024-02-27 MED ORDER — LOSARTAN POTASSIUM 50 MG PO TABS
100.0000 mg | ORAL_TABLET | Freq: Every day | ORAL | Status: DC
  Administered 2024-02-27: 100 mg via ORAL
  Filled 2024-02-27: qty 2

## 2024-02-27 MED ORDER — DOCUSATE SODIUM 100 MG PO CAPS
100.0000 mg | ORAL_CAPSULE | Freq: Two times a day (BID) | ORAL | Status: DC
  Administered 2024-02-27 – 2024-02-28 (×2): 100 mg via ORAL
  Filled 2024-02-27 (×2): qty 1

## 2024-02-27 MED ORDER — LOSARTAN POTASSIUM-HCTZ 100-25 MG PO TABS: 1.0000 | ORAL_TABLET | Freq: Every day | ORAL | Status: DC

## 2024-02-27 MED ORDER — SODIUM CHLORIDE 0.9 % IV SOLN: INTRAVENOUS | Status: DC

## 2024-02-27 MED ORDER — ONDANSETRON HCL 4 MG PO TABS: 4.0000 mg | ORAL_TABLET | Freq: Four times a day (QID) | ORAL | Status: DC | PRN

## 2024-02-27 MED ORDER — APIXABAN 5 MG PO TABS
5.0000 mg | ORAL_TABLET | Freq: Two times a day (BID) | ORAL | Status: DC
  Administered 2024-02-28: 5 mg via ORAL
  Filled 2024-02-27: qty 1

## 2024-02-27 MED ORDER — ACETAMINOPHEN 325 MG PO TABS: 325.0000 mg | ORAL_TABLET | Freq: Four times a day (QID) | ORAL | Status: DC | PRN

## 2024-02-27 MED ORDER — FENTANYL CITRATE (PF) 100 MCG/2ML IJ SOLN
INTRAMUSCULAR | Status: DC | PRN
  Administered 2024-02-27: 50 ug via INTRAVENOUS

## 2024-02-27 MED ORDER — LIDOCAINE 2% (20 MG/ML) 5 ML SYRINGE
INTRAMUSCULAR | Status: AC
Start: 2024-02-27 — End: 2024-02-27
  Filled 2024-02-27: qty 5

## 2024-02-27 MED ORDER — MIDAZOLAM HCL (PF) 2 MG/2ML IJ SOLN
INTRAMUSCULAR | Status: DC | PRN
  Administered 2024-02-27: 2 mg via INTRAVENOUS

## 2024-02-27 MED ORDER — PHENYLEPHRINE 80 MCG/ML (10ML) SYRINGE FOR IV PUSH (FOR BLOOD PRESSURE SUPPORT)
PREFILLED_SYRINGE | INTRAVENOUS | Status: DC | PRN
  Administered 2024-02-27 (×3): 80 ug via INTRAVENOUS

## 2024-02-27 MED ORDER — CEFAZOLIN SODIUM-DEXTROSE 2-4 GM/100ML-% IV SOLN
2.0000 g | INTRAVENOUS | Status: AC
  Administered 2024-02-27: 2 g via INTRAVENOUS
  Filled 2024-02-27: qty 100

## 2024-02-27 MED ORDER — ATORVASTATIN CALCIUM 10 MG PO TABS
20.0000 mg | ORAL_TABLET | Freq: Every day | ORAL | Status: DC
  Administered 2024-02-28: 20 mg via ORAL
  Filled 2024-02-27: qty 2

## 2024-02-27 MED ORDER — POVIDONE-IODINE 10 % EX SWAB
2.0000 | Freq: Once | CUTANEOUS | Status: AC
  Administered 2024-02-27: 2 via TOPICAL

## 2024-02-27 MED ORDER — INSULIN ASPART 100 UNIT/ML IJ SOLN: 0.0000 [IU] | INTRAMUSCULAR | Status: DC | PRN

## 2024-02-27 MED ORDER — MENTHOL 3 MG MT LOZG: 1.0000 | LOZENGE | OROMUCOSAL | Status: DC | PRN

## 2024-02-27 MED ORDER — ONDANSETRON HCL 4 MG/2ML IJ SOLN
INTRAMUSCULAR | Status: DC | PRN
  Administered 2024-02-27: 4 mg via INTRAVENOUS

## 2024-02-27 MED ORDER — PHENYLEPHRINE HCL-NACL 20-0.9 MG/250ML-% IV SOLN
INTRAVENOUS | Status: DC | PRN
Start: 2024-02-27 — End: 2024-02-27
  Administered 2024-02-27: 40 ug/min via INTRAVENOUS

## 2024-02-27 MED ORDER — METFORMIN HCL ER 500 MG PO TB24
500.0000 mg | ORAL_TABLET | Freq: Every day | ORAL | Status: DC
  Administered 2024-02-28: 500 mg via ORAL
  Filled 2024-02-27: qty 1

## 2024-02-27 MED ORDER — METHOCARBAMOL 1000 MG/10ML IJ SOLN: 500.0000 mg | Freq: Four times a day (QID) | INTRAMUSCULAR | Status: DC | PRN

## 2024-02-27 MED ORDER — ONDANSETRON HCL 4 MG/2ML IJ SOLN
4.0000 mg | Freq: Four times a day (QID) | INTRAMUSCULAR | Status: DC | PRN
  Administered 2024-02-27: 4 mg via INTRAVENOUS
  Filled 2024-02-27: qty 2

## 2024-02-27 MED ORDER — METHOCARBAMOL 500 MG PO TABS
500.0000 mg | ORAL_TABLET | Freq: Four times a day (QID) | ORAL | Status: DC | PRN
  Administered 2024-02-27: 500 mg via ORAL
  Filled 2024-02-27: qty 1

## 2024-02-27 MED ORDER — LIDOCAINE 2% (20 MG/ML) 5 ML SYRINGE
INTRAMUSCULAR | Status: DC | PRN
  Administered 2024-02-27: 20 mg via INTRAVENOUS

## 2024-02-27 MED ORDER — LACTATED RINGERS IV SOLN: INTRAVENOUS | Status: DC

## 2024-02-27 MED ORDER — EPHEDRINE SULFATE-NACL 50-0.9 MG/10ML-% IV SOSY
PREFILLED_SYRINGE | INTRAVENOUS | Status: DC | PRN
  Administered 2024-02-27: 5 mg via INTRAVENOUS
  Administered 2024-02-27 (×2): 10 mg via INTRAVENOUS

## 2024-02-27 MED ORDER — ALUM & MAG HYDROXIDE-SIMETH 200-200-20 MG/5ML PO SUSP: 30.0000 mL | ORAL | Status: DC | PRN

## 2024-02-27 MED ORDER — PANTOPRAZOLE SODIUM 40 MG PO TBEC
40.0000 mg | DELAYED_RELEASE_TABLET | Freq: Every day | ORAL | Status: DC
  Administered 2024-02-27 – 2024-02-28 (×2): 40 mg via ORAL
  Filled 2024-02-27 (×2): qty 1

## 2024-02-27 MED ORDER — OXYCODONE HCL 5 MG PO TABS
5.0000 mg | ORAL_TABLET | ORAL | Status: DC | PRN
  Administered 2024-02-27: 10 mg via ORAL
  Administered 2024-02-27 – 2024-02-28 (×2): 5 mg via ORAL
  Filled 2024-02-27 (×3): qty 2

## 2024-02-27 MED ORDER — HYDROMORPHONE HCL 1 MG/ML IJ SOLN: 0.5000 mg | INTRAMUSCULAR | Status: DC | PRN

## 2024-02-27 SURGICAL SUPPLY — 45 items
BAG COUNTER SPONGE SURGICOUNT (BAG) ×2 IMPLANT
BENZOIN TINCTURE PRP APPL 2/3 (GAUZE/BANDAGES/DRESSINGS) ×2 IMPLANT
BLADE CLIPPER SURG (BLADE) IMPLANT
BLADE SAW SGTL 18X1.27X75 (BLADE) ×2 IMPLANT
COLLAR OFFSET CORAIL SZ 16 HIP (Stem) IMPLANT
COVER SURGICAL LIGHT HANDLE (MISCELLANEOUS) ×2 IMPLANT
CUP ACET PNNCL SECTR W/GRIP 56 (Hips) IMPLANT
DRAPE C-ARM 42X72 X-RAY (DRAPES) ×2 IMPLANT
DRAPE STERI IOBAN 125X83 (DRAPES) ×2 IMPLANT
DRAPE U-SHAPE 47X51 STRL (DRAPES) ×6 IMPLANT
DRESSING AQUACEL AG SP 3.5X10 (GAUZE/BANDAGES/DRESSINGS) IMPLANT
DRSG AQUACEL AG ADV 3.5X10 (GAUZE/BANDAGES/DRESSINGS) ×2 IMPLANT
DURAPREP 26ML APPLICATOR (WOUND CARE) ×2 IMPLANT
ELECT BLADE 6.5 EXT (BLADE) IMPLANT
ELECTRODE BLDE 4.0 EZ CLN MEGD (MISCELLANEOUS) ×2 IMPLANT
ELECTRODE REM PT RTRN 9FT ADLT (ELECTROSURGICAL) ×2 IMPLANT
FACESHIELD WRAPAROUND OR TEAM (MASK) ×4 IMPLANT
GLOVE BIOGEL PI IND STRL 8 (GLOVE) ×4 IMPLANT
GLOVE ECLIPSE 8.0 STRL XLNG CF (GLOVE) ×2 IMPLANT
GLOVE ORTHO TXT STRL SZ7.5 (GLOVE) ×4 IMPLANT
GOWN STRL REUS W/ TWL LRG LVL3 (GOWN DISPOSABLE) ×4 IMPLANT
GOWN STRL REUS W/ TWL XL LVL3 (GOWN DISPOSABLE) ×4 IMPLANT
HEAD ARTICULEZE 36 12 (Hips) IMPLANT
HEAD FEMUR METAL 36MM 15.6 HIP (Head) IMPLANT
KIT BASIN OR (CUSTOM PROCEDURE TRAY) ×2 IMPLANT
KIT TURNOVER KIT B (KITS) ×2 IMPLANT
MANIFOLD NEPTUNE II (INSTRUMENTS) ×2 IMPLANT
PACK TOTAL JOINT (CUSTOM PROCEDURE TRAY) ×2 IMPLANT
PAD ARMBOARD POSITIONER FOAM (MISCELLANEOUS) ×2 IMPLANT
PINNACLE ALTRX PLUS 4 N 36X56 (Hips) IMPLANT
PUTTY BONE DBX 2.5 MIS (Bone Implant) IMPLANT
SET HNDPC FAN SPRY TIP SCT (DISPOSABLE) ×2 IMPLANT
SOLN 0.9% NACL POUR BTL 1000ML (IV SOLUTION) ×2 IMPLANT
SOLN STERILE WATER BTL 1000 ML (IV SOLUTION) ×4 IMPLANT
STAPLER SKIN PROX 35W (STAPLE) IMPLANT
STEM FEM ACTIS HIGH SZ8 (Stem) IMPLANT
STRIP CLOSURE SKIN 1/2X4 (GAUZE/BANDAGES/DRESSINGS) ×4 IMPLANT
SUT ETHIBOND NAB CT1 #1 30IN (SUTURE) ×2 IMPLANT
SUT MNCRL AB 4-0 PS2 18 (SUTURE) IMPLANT
SUT VIC AB 0 CT1 27XBRD ANBCTR (SUTURE) ×2 IMPLANT
SUT VIC AB 1 CT1 27XBRD ANBCTR (SUTURE) ×2 IMPLANT
SUT VIC AB 2-0 CT1 TAPERPNT 27 (SUTURE) ×2 IMPLANT
TOWEL GREEN STERILE (TOWEL DISPOSABLE) ×2 IMPLANT
TOWEL GREEN STERILE FF (TOWEL DISPOSABLE) ×2 IMPLANT
TRAY FOLEY W/BAG SLVR 16FR ST (SET/KITS/TRAYS/PACK) IMPLANT

## 2024-02-27 NOTE — Anesthesia Procedure Notes (Signed)
 Spinal  Patient location during procedure: OR Start time: 02/27/2024 10:00 AM End time: 02/27/2024 10:05 AM Reason for block: surgical anesthesia Staffing Performed: anesthesiologist  Anesthesiologist: Keneth Lynwood POUR, MD Performed by: Keneth Lynwood POUR, MD Authorized by: Keneth Lynwood POUR, MD   Preanesthetic Checklist Completed: patient identified, IV checked, site marked, risks and benefits discussed, surgical consent, monitors and equipment checked, pre-op evaluation and timeout performed Spinal Block Patient position: sitting Prep: DuraPrep Patient monitoring: heart rate, cardiac monitor, continuous pulse ox and blood pressure Approach: midline Location: L3-4 Injection technique: single-shot Needle Needle type: Sprotte  Needle gauge: 24 G Needle length: 9 cm Assessment Sensory level: T4 Events: CSF return

## 2024-02-27 NOTE — Anesthesia Postprocedure Evaluation (Signed)
 Anesthesia Post Note  Patient: Frederick Harris  Procedure(s) Performed: ARTHROPLASTY, HIP, TOTAL, ANTERIOR APPROACH (Right: Hip)     Patient location during evaluation: PACU Anesthesia Type: Spinal Level of consciousness: awake and alert Pain management: pain level controlled Vital Signs Assessment: post-procedure vital signs reviewed and stable Respiratory status: spontaneous breathing, nonlabored ventilation, respiratory function stable and patient connected to nasal cannula oxygen Cardiovascular status: blood pressure returned to baseline and stable Postop Assessment: no apparent nausea or vomiting Anesthetic complications: no   No notable events documented.  Last Vitals:  Vitals:   02/27/24 1315 02/27/24 1330  BP: (!) 137/51 (!) 131/48  Pulse: (!) 50 (!) 51  Resp: 14 11  Temp:    SpO2: 99% 98%    Last Pain:  Vitals:   02/27/24 1315  PainSc: 0-No pain                 Frederick Harris

## 2024-02-27 NOTE — Evaluation (Signed)
 Physical Therapy Evaluation Patient Details Name: Frederick Harris MRN: [id number] DOB: 1951-05-01 Today's Date: 02/27/2024  History of Present Illness  Pt is a 73 y.o. male who presented 10/21 for elective direct anterior R THA. PMH: CHF, DM, HTN, PE  Clinical Impression  Pt presents with condition above and deficits mentioned below, see PT Problem List. PTA, he was mod I intermittently using a SPC for R hip pain management, driving, working at a golf course, and living with his wife in a 1-level house with 2 STE. His wife can provide 24/7 care at d/c. Currently, the pt is still experiencing numbness in his bil buttocks and legs (R>L) and displaying weakness and ROM deficits in his R leg. He has baseline L foot drop. His sensation and strength are expected to improve as the time from surgery increases. Currently, due to the deficits mentioned, pt is requiring modA to transfer to stand, minA to manage his R leg to transition supine to sit EOB, and minA-CGA to ambulate in the room with a RW. He is anticipated to progress quickly. Follow physician's recommendations for discharge plan and follow up therapies. Will continue to follow acutely. Educated pt on mobilizing frequently, elevating leg and applying ice as able to manage edema and pain, and provided pt with THA HEP handout to review. He verbalized understanding.         If plan is discharge home, recommend the following: A lot of help with walking and/or transfers;A lot of help with bathing/dressing/bathroom;Assistance with cooking/housework;Assist for transportation;Help with stairs or ramp for entrance   Can travel by private vehicle        Equipment Recommendations BSC/3in1  Recommendations for Other Services       Functional Status Assessment Patient has had a recent decline in their functional status and demonstrates the ability to make significant improvements in function in a reasonable and predictable amount of time.     Precautions  / Restrictions Precautions Precautions: Fall Restrictions Weight Bearing Restrictions Per Provider Order: Yes RLE Weight Bearing Per Provider Order: Weight bearing as tolerated      Mobility  Bed Mobility Overal bed mobility: Needs Assistance Bed Mobility: Supine to Sit     Supine to sit: Min assist, HOB elevated     General bed mobility comments: MinA needed to bring R leg off L EOB to sit up, HOB elevated    Transfers Overall transfer level: Needs assistance Equipment used: Rolling walker (2 wheels) Transfers: Sit to/from Stand Sit to Stand: Mod assist           General transfer comment: Cues needed for hand placement, L knee blocked for safety, modA needed to power up to stand and maintain balance while transitioning hands one at a time from EOB to RW.    Ambulation/Gait Ambulation/Gait assistance: Min assist, Contact guard assist Gait Distance (Feet): 40 Feet Assistive device: Rolling walker (2 wheels) Gait Pattern/deviations: Step-to pattern, Decreased stance time - right, Decreased stride length, Decreased weight shift to right, Antalgic, Trunk flexed Gait velocity: reduced Gait velocity interpretation: <1.31 ft/sec, indicative of household ambulator   General Gait Details: Pt takes slow, small, antalgic steps. Limited distance for safety due to continued numbness and weakness s/p THA today. Cues needed to remain proximal within RW. MinA progressing to CGA for balance and safety  Stairs            Wheelchair Mobility     Tilt Bed    Modified Rankin (Stroke Patients Only)  Balance Overall balance assessment: Needs assistance Sitting-balance support: No upper extremity supported, Feet supported Sitting balance-Leahy Scale: Fair     Standing balance support: Bilateral upper extremity supported, During functional activity, Reliant on assistive device for balance Standing balance-Leahy Scale: Poor Standing balance comment: reliant on RW and  external physical assistance                             Pertinent Vitals/Pain Pain Assessment Pain Assessment: Faces Faces Pain Scale: Hurts even more Pain Location: R hip Pain Descriptors / Indicators: Discomfort, Grimacing, Guarding, Operative site guarding, Numbness Pain Intervention(s): Limited activity within patient's tolerance, Monitored during session, Repositioned, RN gave pain meds during session, Ice applied    Home Living Family/patient expects to be discharged to:: Private residence Living Arrangements: Spouse/significant other Available Help at Discharge: Family;Available 24 hours/day Type of Home: House Home Access: Stairs to enter Entrance Stairs-Rails: None Entrance Stairs-Number of Steps: 2   Home Layout: One level Home Equipment: Educational psychologist (2 wheels);Cane - single point      Prior Function Prior Level of Function : Independent/Modified Independent;Working/employed;Driving             Mobility Comments: Mod I using SPC intermittently to manage hip pain for past 1 year, no AD prior to hip pain onset ADLs Comments: works at golf course in the grill room     Extremity/Trunk Assessment   Upper Extremity Assessment Upper Extremity Assessment: Overall WFL for tasks assessed    Lower Extremity Assessment Lower Extremity Assessment: RLE deficits/detail;LLE deficits/detail RLE Deficits / Details: decreased sensation to light touch throughout bil legs (R>L) since R THA, sensation gradually returning; decreased strength and hip ROM s/p THA, gross MMT scores of 2 to 2+ at hip and 3+ knee extension and 4 ankle dorsiflexion RLE Sensation: decreased light touch LLE Deficits / Details: decreased sensation to light touch throughout bil legs (R>L) since R THA, sensation gradually returning; MMT scores of 4+ knee extension, 2+ ankle dorsiflexion (hx of drop foot per pt, does not use AFO) LLE Sensation: decreased light touch    Cervical /  Trunk Assessment Cervical / Trunk Assessment: Normal  Communication   Communication Communication: No apparent difficulties    Cognition Arousal: Alert Behavior During Therapy: WFL for tasks assessed/performed   PT - Cognitive impairments: No apparent impairments                         Following commands: Intact       Cueing Cueing Techniques: Verbal cues     General Comments General comments (skin integrity, edema, etc.): Educated pt on mobilizing frequently, elevating leg and applying ice as able to manage edema and pain, and provided pt with THA HEP handout to review. He verbalized understanding.    Exercises     Assessment/Plan    PT Assessment Patient needs continued PT services  PT Problem List Decreased strength;Decreased range of motion;Decreased activity tolerance;Decreased balance;Decreased mobility;Impaired sensation;Pain       PT Treatment Interventions DME instruction;Gait training;Stair training;Functional mobility training;Therapeutic activities;Therapeutic exercise;Balance training;Neuromuscular re-education;Patient/family education    PT Goals (Current goals can be found in the Care Plan section)  Acute Rehab PT Goals Patient Stated Goal: to improve PT Goal Formulation: With patient Time For Goal Achievement: 03/05/24 Potential to Achieve Goals: Good    Frequency 7X/week     Co-evaluation  AM-PAC PT 6 Clicks Mobility  Outcome Measure Help needed turning from your back to your side while in a flat bed without using bedrails?: A Little Help needed moving from lying on your back to sitting on the side of a flat bed without using bedrails?: A Little Help needed moving to and from a bed to a chair (including a wheelchair)?: A Little Help needed standing up from a chair using your arms (e.g., wheelchair or bedside chair)?: A Lot Help needed to walk in hospital room?: A Little Help needed climbing 3-5 steps with a  railing? : A Lot 6 Click Score: 16    End of Session Equipment Utilized During Treatment: Gait belt Activity Tolerance: Patient tolerated treatment well Patient left: in chair;with call bell/phone within reach;with chair alarm set Nurse Communication: Mobility status;Other (comment) (pt reporting feeling like his sugar was low, provided pt with graham crackers and had NT check blood sugar) PT Visit Diagnosis: Unsteadiness on feet (R26.81);Other abnormalities of gait and mobility (R26.89);Muscle weakness (generalized) (M62.81);Difficulty in walking, not elsewhere classified (R26.2);Pain Pain - Right/Left: Right Pain - part of body: Hip    Time: 8457-8397 PT Time Calculation (min) (ACUTE ONLY): 20 min   Charges:   PT Evaluation $PT Eval Low Complexity: 1 Low   PT General Charges $$ ACUTE PT VISIT: 1 Visit         Theo Ferretti, PT, DPT Acute Rehabilitation Services  Office: [phone]   Theo CHRISTELLA Ferretti 02/27/2024, 4:19 PM

## 2024-02-27 NOTE — Progress Notes (Signed)
 Physical therapy got pt up to chair right after surgery and pt did not tolerate well. Pt threw up right away in chair after therapy left.  Administered zofran as ordered.    After pt calling out, patient was assisted onto the [facility] and became very nauseated.  A vagal response occurred, and the pt became unresponsive for one second, but then regained responsiveness right away. VS stable.  Placed pt back to bed with stedy and 3 + assist.  Pt appears very weak and keeps dry heaving.  Pt ate 2 saltines and sipping on ginger ale.  Will continue to monitor.

## 2024-02-27 NOTE — Op Note (Signed)
 Operative Note  Date of operation: 02/27/2024 Preoperative diagnosis: Right hip primary osteoarthritis Postoperative diagnosis: Same  Procedure: Right direct anterior total hip arthroplasty  Implants: Implant Name Type Inv. Item Serial No. Manufacturer Lot No. LRB No. Used Action  CUP ACET PNNCL SECTR W/GRIP 56 - [id number] Hips CUP ACET PNNCL SECTR W/GRIP 56  [employer] [id number] Right 1 Implanted  PINNACLE ALTRX PLUS 4 N [id number] - [id number] Hips PINNACLE ALTRX PLUS 4 N 36X56  [employer] [id number] Right 1 Implanted  HEAD ARTICULEZE 36 12 - [id number] Hips HEAD ARTICULEZE 36 12  [employer] [id number] Right 1 Implanted  COLLAR OFFSET CORAIL SZ 16 HIP - [id number] Stem COLLAR OFFSET CORAIL SZ 16 HIP  [employer] [id number] Right 1 Implanted  PUTTY BONE DBX 2.5 MIS - [id number] Bone Implant PUTTY BONE DBX 2.5 MIS [id number] MUSCULOSKELETL TRANSPLANT FNDN  Right 1 Implanted   Surgeon: Lonni GRADE. Vernetta, MD Assistant: Tory Gaskins, PA-C  Anesthesia: Spinal EBL: 300 cc Antibiotics: IV Ancef Complications: None  Indications: The patient is a 73 year old gentleman with significant arthritis involving his right hip that is quite severe.  He has significant shortening of his right lower extremity compared to left lower extremity that does have arthritis in the left hip.  However his right hip shows flattening the femoral head and quite severe arthritis.  His rotation is significantly limited as well.  At this point he wishes to proceed with a hip replacement and we agree with this as well given his daily hip pain on that right side that is detriment affecting his mobility, his quality of life and his actives a day living.  We did talk at length in detail about the risks of acute blood loss anemia, nerve and vessel injury, fracture, infection, DVT, dislocation, implant failure, leg length differences and wound healing issues.  He understands that our goals are  hopefully decreased pain, improved mobility and improve quality of life.  Procedure description: After informed consent was obtained and the appropriate right hip was marked, the patient was brought to the operating room and set up on the stretcher where spinal anesthesia was obtained.  He was laid in spine position on stretcher and a Foley catheter is placed.  Again we assessed his leg lengths and significantly shoulder on his right side comparing his right and left sides.  Traction boots were placed on both his feet and next he was placed supine on the Hana fracture table with a perineal post and placed in both legs and in line skeletal traction devices no traction applied.  His right operative hip and pelvis were assessed radiographically.  The right hip was prepped and draped with DuraPrep and sterile drapes.  Timeout was called and is identified as the correct patient and correct right hip.  An incision was then made just inferior and posterior to the ASIS and carried slightly obliquely down the leg.  Dissection was carried down to the tensor fascia lata muscle and the tensor fascia was divided longitudinally to proceed with a direct interpreter to the hip.  Circumflex vessels were identified cauterized.  The hip capsule identified and opened up in L-type format.  There was significant arthritis around his femoral head and neck..  We did make a femoral neck cut but did a significant short neck the cut was made at the level of the lesser trochanter.  We tried to go a little bit higher but osteophytes and angulation of the sole as well as a larger thigh guiding  the cut a little bit more distal than we would have liked.  A corkscrew gauze placed in the femoral head and the femoral head was removed in its entirety and it was completely devoid of cartilage.  A bent Hohmann was then placed over the medial sterile rim and remanence of the acetabular labrum and other debris removed.  Reaming was initiated from a size  43 reamer and stepwise increments going up to a size 55 reamer with all reamers placed under direct visualization the last reamer placed under direct fluoroscopy in order to obtain the depth of reaming, the inclination and anteversion.  The real DePuy sector GRIPTION asked our component size 56 was then placed without difficulty followed by 36+4 polythene liner.  Attention was then turned to the femur.  With the right leg externally rotated to 120 degrees, extended and adducted, and new retractors placed medially and Hohmann tractor behind the greater trochanter.  We then used the Actis broaching system for broaching.  The box cutting osteotome was used into the femoral canal and then we began broaching from a size 0 going up to a size 6.  However just noting the placement of the stem and the lower cut we noted we needed to go up to higher stem size to get even a higher offset.  We were able to micro well up to a size 9 and then we trialed a high offset femoral neck and had to go up to a 12 length head ball.  This reduced in the pelvis and it was felt to be stable we felt that we had good stability so we dislocated the hip and remove the trial components.  Replaced the real Actis femoral component with high offset size 9 and the real 36+15 head ball.  This reduced in the pelvis and it was stable and with traction but external rotation just past 90 degrees I just was not happy with the stability so we dislocated the hip and removed all the real components were placed in.  We decided to go up to a longer stem for the Corrail system from DePuy and so we broached up to a size 16 Corrail broach and we trialed this with a 36+12 head ball.  This reduced the pelvis and we definitely maximize his leg length and is pleased with tightness and stability overall.  We dislocated the hip and with the trial components.  We then placed the real size 16 Corail femoral component and the real 36+12 head bone again reduces the pelvis  and is very tight and stable.  We did place some of the patient's own femoral head bone graft around the stem proximally as well as some DBM putty.  We then closed remanence of joint capsule interrupted #1 Ethibond suture followed by normal Vicryl close tensor fascia.  0 Vicryl is used as deep tissue and 2-0 Vicryl is used to close subcutaneous tissue.  The skin was closed with staples.  Patient was taken off the Hana table and taken the recovery room.  Tory Gaskins PA-C did assist in the entire case and beginning then his assistance was incredibly crucial for soft tissue management as well as medically necessary for helping guide implant placement and a layered closure of the wound.

## 2024-02-27 NOTE — Transfer of Care (Signed)
 Immediate Anesthesia Transfer of Care Note  Patient: Frederick Harris  Procedure(s) Performed: ARTHROPLASTY, HIP, TOTAL, ANTERIOR APPROACH (Right: Hip)  Patient Location: PACU  Anesthesia Type:Spinal  Level of Consciousness: awake, alert , and patient cooperative  Airway & Oxygen Therapy: Patient Spontanous Breathing  Post-op Assessment: Report given to RN and Post -op Vital signs reviewed and stable  Post vital signs: Reviewed and stable  Last Vitals:  Vitals Value Taken Time  BP 121/61 02/27/24 12:20  Temp    Pulse 95 02/27/24 12:21  Resp 15 02/27/24 12:21  SpO2 97 % 02/27/24 12:21  Vitals shown include unfiled device data.  Last Pain:  Vitals:   02/27/24 0815  PainSc: 0-No pain         Complications: No notable events documented.

## 2024-02-27 NOTE — Interval H&P Note (Signed)
 History and Physical Interval Note: Patient understands that he is here today for right total hip replacement to treat his significant right hip pain and arthritis.  There has been no acute or interval change in his medical status.  The risks and benefits of surgery have been discussed in detail and informed consent has been obtained.  The right operative hip has been marked.  02/27/2024 8:59 AM  Frederick Harris  has presented today for surgery, with the diagnosis of right hip osteoarthritis.  The various methods of treatment have been discussed with the patient and family. After consideration of risks, benefits and other options for treatment, the patient has consented to  Procedure(s): ARTHROPLASTY, HIP, TOTAL, ANTERIOR APPROACH (Right) as a surgical intervention.  The patient's history has been reviewed, patient examined, no change in status, stable for surgery.  I have reviewed the patient's chart and labs.  Questions were answered to the patient's satisfaction.     Frederick Harris

## 2024-02-28 ENCOUNTER — Encounter (HOSPITAL_COMMUNITY): Payer: Self-pay | Admitting: Orthopaedic Surgery

## 2024-02-28 DIAGNOSIS — M1611 Unilateral primary osteoarthritis, right hip: Secondary | ICD-10-CM | POA: Diagnosis not present

## 2024-02-28 LAB — BASIC METABOLIC PANEL WITH GFR
Anion gap: 9 (ref 5–15)
BUN: 12 mg/dL (ref 8–23)
CO2: 25 mmol/L (ref 22–32)
Calcium: 8.3 mg/dL — ABNORMAL LOW (ref 8.9–10.3)
Chloride: 98 mmol/L (ref 98–111)
Creatinine, Ser: 0.9 mg/dL (ref 0.61–1.24)
GFR, Estimated: >60 mL/min (ref >60)
Glucose, Bld: 158 mg/dL — ABNORMAL HIGH (ref 70–99)
Potassium: 3.8 mmol/L (ref 3.5–5.1)
Sodium: 132 mmol/L — ABNORMAL LOW (ref 135–145)

## 2024-02-28 LAB — CBC
HCT: 30.1 % — ABNORMAL LOW (ref 39.0–52.0)
Hemoglobin: 10.1 g/dL — ABNORMAL LOW (ref 13.0–17.0)
MCH: 28.2 pg (ref 26.0–34.0)
MCHC: 33.6 g/dL (ref 30.0–36.0)
MCV: 84.1 fL (ref 80.0–100.0)
Platelets: 232 K/uL (ref 150–400)
RBC: 3.58 MIL/uL — ABNORMAL LOW (ref 4.22–5.81)
RDW: 13.2 % (ref 11.5–15.5)
WBC: 7 K/uL (ref 4.0–10.5)
nRBC: 0 % (ref 0.0–0.2)

## 2024-02-28 MED ORDER — TIZANIDINE HCL 2 MG PO TABS: 2.0000 mg | ORAL_TABLET | Freq: Four times a day (QID) | ORAL | 1 refills | Status: AC | PRN

## 2024-02-28 MED ORDER — OXYCODONE HCL 5 MG PO TABS: 5.0000 mg | ORAL_TABLET | Freq: Four times a day (QID) | ORAL | 0 refills | Status: DC | PRN

## 2024-02-28 NOTE — Care Management Obs Status (Signed)
 MEDICARE OBSERVATION STATUS NOTIFICATION   Patient Details  Name: Frederick Harris MRN: [id number] Date of Birth: [birth year]   Medicare Observation Status Notification Given:  Yes    Jon Cruel 02/28/2024, 10:57 AM

## 2024-02-28 NOTE — Progress Notes (Signed)
 Subjective: 1 Day Post-Op Procedure(s) (LRB): ARTHROPLASTY, HIP, TOTAL, ANTERIOR APPROACH (Right) Patient reports pain as moderate.    Objective: Vital signs in last 24 hours: Temp:  [97.7 F (36.5 C)-98.4 F (36.9 C)] 98.4 F (36.9 C) (10/22 0442) Pulse Rate:  [50-95] 87 (10/22 0442) Resp:  [10-18] 17 (10/22 0442) BP: (115-168)/(48-78) 127/59 (10/22 0442) SpO2:  [97 %-100 %] 97 % (10/22 0442) Weight:  [93.9 kg] 93.9 kg (10/21 0805)  Intake/Output from previous day: 10/21 0701 - 10/22 0700 In: 1190 [P.O.:240; I.V.:850; IV Piggyback:100] Out: 775 [Urine:475; Blood:300] Intake/Output this shift: No intake/output data recorded.  Recent Labs    02/28/24 0545  HGB 10.1*   Recent Labs    02/28/24 0545  WBC 7.0  RBC 3.58*  HCT 30.1*  PLT 232   Recent Labs    02/28/24 0545  NA 132*  K 3.8  CL 98  CO2 25  BUN 12  CREATININE 0.90  GLUCOSE 158*  CALCIUM 8.3*   No results for input(s): LABPT, INR in the last 72 hours.  Sensation intact distally Intact pulses distally Dorsiflexion/Plantar flexion intact Incision: scant drainage   Assessment/Plan: 1 Day Post-Op Procedure(s) (LRB): ARTHROPLASTY, HIP, TOTAL, ANTERIOR APPROACH (Right) Up with therapy      Lonni CINDERELLA Poli 02/28/2024, 7:28 AM

## 2024-02-28 NOTE — Plan of Care (Signed)
  Problem: Education: Goal: Knowledge of General Education information will improve Description: Including pain rating scale, medication(s)/side effects and non-pharmacologic comfort measures Outcome: Progressing   Problem: Health Behavior/Discharge Planning: Goal: Ability to manage health-related needs will improve Outcome: Progressing   Problem: Clinical Measurements: Goal: Ability to maintain clinical measurements within normal limits will improve Outcome: Progressing Goal: Will remain free from infection Outcome: Progressing Goal: Diagnostic test results will improve Outcome: Progressing Goal: Respiratory complications will improve Outcome: Progressing Goal: Cardiovascular complication will be avoided Outcome: Progressing   Problem: Activity: Goal: Risk for activity intolerance will decrease Outcome: Progressing   Problem: Nutrition: Goal: Adequate nutrition will be maintained Outcome: Progressing   Problem: Activity: Goal: Risk for activity intolerance will decrease Outcome: Progressing   Problem: Nutrition: Goal: Adequate nutrition will be maintained Outcome: Progressing

## 2024-02-28 NOTE — Progress Notes (Signed)
 Physical Therapy Treatment Patient Details Name: Frederick Harris MRN: [id number] DOB: February 22, 1951 Today's Date: 02/28/2024   History of Present Illness Pt is a 73 y.o. male who presented 10/21 for elective direct anterior R THA. PMH: CHF, DM, HTN, PE    PT Comments  RN reporting pt had moment of unresponsiveness on commode yesterday. The pt denied any lightheadedness this session and orthostatics were negative. Focused session on reviewing his THA HEP handout and on stair training in preparation for d/c home. The pt demonstrated weakness and limited ROM with all hip movements, needing AAROM to complete the exercises. The pt also heavily relied on bil UE support to navigate up and down x2 stairs, needing CGA to ascend and minA to descend. Pt prefers tod descend with his L leg, but reports understanding of education to lead up with his L and down with his R. He reports he has the support needed to assist him in ascending his x2 STE his home at d/c. Will continue to follow acutely.    If plan is discharge home, recommend the following: A lot of help with bathing/dressing/bathroom;Assistance with cooking/housework;Assist for transportation;Help with stairs or ramp for entrance;A little help with walking and/or transfers   Can travel by private vehicle        Equipment Recommendations  BSC/3in1    Recommendations for Other Services       Precautions / Restrictions Precautions Precautions: Fall Restrictions Weight Bearing Restrictions Per Provider Order: Yes RLE Weight Bearing Per Provider Order: Weight bearing as tolerated     Mobility  Bed Mobility Overal bed mobility: Needs Assistance Bed Mobility: Supine to Sit, Sit to Supine     Supine to sit: Min assist, HOB elevated, Used rails Sit to supine: Min assist, Used rails, HOB elevated   General bed mobility comments: MinA needed to bring R leg on and off bed and for pt to pull with HHA to bring trunk up to sit. Cues provided to hook R  foot with L to assist it but p's L leg unable to get under R leg efficiently to assist it    Transfers Overall transfer level: Needs assistance Equipment used: Rolling walker (2 wheels) Transfers: Sit to/from Stand, Bed to chair/wheelchair/BSC Sit to Stand: Contact guard assist   Step pivot transfers: Contact guard assist       General transfer comment: Extra time for pt to figure out which hand he preferred to push up from the bed with, ultimately pushing up with L hand on bed and R hand on RW. First rep from elevated EOB but x2 subsequent reps from recliner, CGA for safety with all transfers    Ambulation/Gait Ambulation/Gait assistance: Contact guard assist Gait Distance (Feet): 20 Feet Assistive device: Rolling walker (2 wheels) Gait Pattern/deviations: Step-to pattern, Decreased stance time - right, Decreased stride length, Decreased weight shift to right, Antalgic, Trunk flexed Gait velocity: reduced Gait velocity interpretation: <1.31 ft/sec, indicative of household ambulator   General Gait Details: Pt takes slow, small, antalgic steps. Cues needed to remain proximal within RW. No LOB, CGA for safety, pt deferring further gait due to pain and not sleeping well and plan to focus session on stair training anyway   Stairs Stairs: Yes Stairs assistance: Contact guard assist, Min assist Stair Management: One rail Left, One rail Right, Forwards, Step to pattern, Sideways Number of Stairs: 2 General stair comments: Educated pt to lead up with his good (L) leg and down with his bad (R) leg. Ascends with  bil hands on L rail, semi turned sideways, leading up with L leg as cued, CGA for safety, needing cues to shift weight anteriorly to power up each step, noted pt leans back to hike L foot up to next step. Descends forward with R hand on R rail and L hand on therapist, minA for balance, preferring to descend leading with L leg instead.   Wheelchair Mobility     Tilt Bed     Modified Rankin (Stroke Patients Only)       Balance Overall balance assessment: Needs assistance Sitting-balance support: No upper extremity supported, Feet supported Sitting balance-Leahy Scale: Fair     Standing balance support: Bilateral upper extremity supported, During functional activity, Reliant on assistive device for balance, No upper extremity supported Standing balance-Leahy Scale: Fair Standing balance comment: briefly able to stand statically without UE support, relies on RW to ambulate                            Communication Communication Communication: No apparent difficulties  Cognition Arousal: Alert Behavior During Therapy: WFL for tasks assessed/performed   PT - Cognitive impairments: No apparent impairments                         Following commands: Intact      Cueing Cueing Techniques: Verbal cues  Exercises Total Joint Exercises Ankle Circles/Pumps: AROM, Both, 10 reps, Supine Short Arc Quad: AROM, Strengthening, Both, 10 reps, Supine Heel Slides: AAROM, Right, 10 reps, Supine Hip ABduction/ADduction: AAROM, Strengthening, Right, 10 reps, Supine (verbally reviewed standing exercise) Long Arc Quad: AROM, Right, 10 reps, Seated Marching in Standing:  (verbally reviewed) Standing Hip Extension:  (verbally reviewed)    General Comments General comments (skin integrity, edema, etc.): BP stable, orthostatics negative      Pertinent Vitals/Pain Pain Assessment Pain Assessment: Faces Faces Pain Scale: Hurts even more Pain Location: R hip Pain Descriptors / Indicators: Discomfort, Grimacing, Guarding, Operative site guarding, Numbness Pain Intervention(s): Limited activity within patient's tolerance, Monitored during session, Premedicated before session, Repositioned, Ice applied    Home Living                          Prior Function            PT Goals (current goals can now be found in the care plan  section) Acute Rehab PT Goals Patient Stated Goal: to improve PT Goal Formulation: With patient Time For Goal Achievement: 03/05/24 Potential to Achieve Goals: Good Progress towards PT goals: Progressing toward goals    Frequency    7X/week      PT Plan      Co-evaluation              AM-PAC PT 6 Clicks Mobility   Outcome Measure  Help needed turning from your back to your side while in a flat bed without using bedrails?: A Little Help needed moving from lying on your back to sitting on the side of a flat bed without using bedrails?: A Little Help needed moving to and from a bed to a chair (including a wheelchair)?: A Little Help needed standing up from a chair using your arms (e.g., wheelchair or bedside chair)?: A Little Help needed to walk in hospital room?: A Little Help needed climbing 3-5 steps with a railing? : A Little 6 Click Score: 18    End  of Session Equipment Utilized During Treatment: Gait belt Activity Tolerance: Patient tolerated treatment well;Patient limited by pain Patient left: with call bell/phone within reach;in bed;with bed alarm set Nurse Communication: Mobility status;Other (comment) (BP stable) PT Visit Diagnosis: Unsteadiness on feet (R26.81);Other abnormalities of gait and mobility (R26.89);Muscle weakness (generalized) (M62.81);Difficulty in walking, not elsewhere classified (R26.2);Pain Pain - Right/Left: Right Pain - part of body: Hip     Time: 9146-9069 PT Time Calculation (min) (ACUTE ONLY): 37 min  Charges:    $Gait Training: 8-22 mins $Therapeutic Exercise: 8-22 mins PT General Charges $$ ACUTE PT VISIT: 1 Visit                     Theo Ferretti, PT, DPT Acute Rehabilitation Services  Office: [phone]    Theo CHRISTELLA Ferretti 02/28/2024, 9:46 AM

## 2024-02-28 NOTE — Discharge Summary (Signed)
 Patient ID: Frederick Harris MRN: [id number] DOB/AGE: 07-02-1950 73 y.o.  Admit date: 02/27/2024 Discharge date: 02/28/2024  Admission Diagnoses:  Principal Problem:   Unilateral primary osteoarthritis, right hip Active Problems:   Status post total replacement of right hip   Discharge Diagnoses:  Same  Past Medical History:  Diagnosis Date   CHF (congestive heart failure) (HCC)    Diabetes mellitus without complication (HCC)    Hypertension    Pulmonary embolism (HCC) 07/05/2023    Surgeries: Procedure(s): ARTHROPLASTY, HIP, TOTAL, ANTERIOR APPROACH on 02/27/2024   Consultants:   Discharged Condition: Improved  Hospital Course: Frederick Harris is an 73 y.o. male who was admitted 02/27/2024 for operative treatment ofUnilateral primary osteoarthritis, right hip. Patient has severe unremitting pain that affects sleep, daily activities, and work/hobbies. After pre-op clearance the patient was taken to the operating room on 02/27/2024 and underwent  Procedure(s): ARTHROPLASTY, HIP, TOTAL, ANTERIOR APPROACH.    Patient was given perioperative antibiotics:  Anti-infectives (From admission, onward)    Start     Dose/Rate Route Frequency Ordered Stop   02/27/24 1615  ceFAZolin (ANCEF) IVPB 2g/100 mL premix        2 g 200 mL/hr over 30 Minutes Intravenous Every 6 hours 02/27/24 1517 02/27/24 2104   02/27/24 0745  ceFAZolin (ANCEF) IVPB 2g/100 mL premix        2 g 200 mL/hr over 30 Minutes Intravenous On call to O.R. 02/27/24 0744 02/27/24 1013        Patient was given sequential compression devices, early ambulation, and chemoprophylaxis to prevent DVT.  Inpatient Morphine Milligram Equivalents Per Day 10/21 - 10/22   Values displayed are in units of MME/Day    Order Start / End Date Yesterday Today    fentaNYL (SUBLIMAZE) injection 10/21 - 10/21 *15 of 15 --    Daily Totals  * 15 of 15 --  *One-Step medication     Patient benefited maximally from hospital stay and there were  no complications.    Recent vital signs: Patient Vitals for the past 24 hrs:  BP Temp Temp src Pulse Resp SpO2  02/28/24 0442 (!) 127/59 98.4 F (36.9 C) -- 87 17 97 %  02/27/24 1949 115/63 98.3 F (36.8 C) -- 79 17 100 %  02/27/24 1722 (!) 168/78 -- -- 89 -- 100 %  02/27/24 1513 (!) 156/60 97.7 F (36.5 C) Oral 63 17 100 %  02/27/24 1445 (!) 150/55 -- -- (!) 59 12 98 %     Recent laboratory studies:  Recent Labs    02/28/24 0545  WBC 7.0  HGB 10.1*  HCT 30.1*  PLT 232  NA 132*  K 3.8  CL 98  CO2 25  BUN 12  CREATININE 0.90  GLUCOSE 158*  CALCIUM 8.3*     Discharge Medications:   Allergies as of 02/28/2024       Reactions   Iodine Other (See Comments)   Pt stated contrast dye and betadine do not cause a reaction.    Shellfish Allergy Hives, Itching, Swelling        Medication List     TAKE these medications    artificial tears ophthalmic solution Place 1 drop into both eyes as needed for dry eyes.   atorvastatin 20 MG tablet Commonly known as: LIPITOR Take 20 mg by mouth daily.   carvedilol  6.25 MG tablet Commonly known as: COREG  Take 1 tablet (6.25 mg total) by mouth 2 (two) times daily with a meal.  Eliquis  5 MG Tabs tablet Generic drug: apixaban  Take 1 tablet (5 mg total) by mouth 2 (two) times daily.   losartan -hydrochlorothiazide  100-25 MG tablet Commonly known as: HYZAAR Take 1 tablet by mouth at bedtime.   MAGNESIUM PO Take 1 tablet by mouth daily.   metFORMIN  500 MG 24 hr tablet Commonly known as: GLUCOPHAGE -XR Take 500 mg by mouth daily. Per patient he does not take daily, not taken in a while   oxyCODONE 5 MG immediate release tablet Commonly known as: Oxy IR/ROXICODONE Take 1-2 tablets (5-10 mg total) by mouth every 6 (six) hours as needed for moderate pain (pain score 4-6) (pain score 4-6).   tiZANidine 2 MG tablet Commonly known as: ZANAFLEX Take 1 tablet (2 mg total) by mouth every 6 (six) hours as needed for muscle  spasms.   VITAMIN B-12 PO Take 1 tablet by mouth daily.   Vitamin D3 Liqd Take 5 drops by mouth daily.               Durable Medical Equipment  (From admission, onward)           Start     Ordered   02/27/24 1518  DME 3 n 1  Once        02/27/24 1517   02/27/24 1518  DME Walker rolling  Once       Question Answer Comment  Walker: With 5 Inch Wheels   Patient needs a walker to treat with the following condition Status post total replacement of right hip      02/27/24 1517            Diagnostic Studies: DG Pelvis Portable Result Date: 02/27/2024 CLINICAL DATA:  Status post right hip arthroplasty. EXAM: PORTABLE PELVIS 1-2 VIEWS COMPARISON:  None Available. FINDINGS: Right hip arthroplasty in expected alignment. Apparent irregularity about the greater trochanter is likely due to overlying soft tissue gas, less likely nondisplaced fracture. Recent postsurgical change includes air and edema in the soft tissues. Overlying skin staples in place. IMPRESSION: 1. Right hip arthroplasty in expected alignment. 2. Apparent irregularity about the greater trochanter is likely due to overlying soft tissue gas related to recent surgery, less likely nondisplaced fracture. Electronically Signed   By: Frederick Harris M.D.   On: 02/27/2024 13:48   DG HIP UNILAT WITH PELVIS 2-3 VIEWS RIGHT Result Date: 02/27/2024 CLINICAL DATA:  Elective surgery. EXAM: DG HIP (WITH OR WITHOUT PELVIS) 2-3V RIGHT COMPARISON:  None Available. FINDINGS: Five fluoroscopic spot views of the pelvis and right hip obtained in the operating room. Sequential images during hip arthroplasty. Fluoroscopy time 32.1 seconds. Dose 3.3057 mGy. IMPRESSION: Intraoperative fluoroscopy during right hip arthroplasty. Electronically Signed   By: Frederick Harris M.D.   On: 02/27/2024 13:47   DG C-Arm 1-60 Min-No Report Result Date: 02/27/2024 Fluoroscopy was utilized by the requesting physician.  No radiographic interpretation.    DG C-Arm 1-60 Min-No Report Result Date: 02/27/2024 Fluoroscopy was utilized by the requesting physician.  No radiographic interpretation.   CT CORONARY MORPH W/CTA COR W/SCORE W/CA W/CM &/OR WO/CM Addendum Date: 02/09/2024 ADDENDUM REPORT: 02/09/2024 13:09 EXAM: OVER-READ INTERPRETATION  CT CHEST The following report is an over-read performed by radiologist Dr. Selinda Saas [facility], Frederick Harris on 02/09/2024. This over-read does not include interpretation of cardiac or coronary anatomy or pathology. The coronary CTA interpretation by the cardiologist is attached. COMPARISON:  07/05/2023 chest CT angiogram FINDINGS: Cardiovascular: Normal heart size. No significant pericardial effusion/thickening. Atherosclerotic nonaneurysmal thoracic aorta. Normal  caliber pulmonary arteries. No central pulmonary emboli. Mediastinum/Nodes: Unremarkable esophagus. No pathologically enlarged mediastinal or hilar lymph nodes. Lungs/Pleura: No pneumothorax. No pleural effusion. No acute consolidative airspace disease, lung masses or significant pulmonary nodules. Upper abdomen: Moderate elevation of the left hemidiaphragm is unchanged. Musculoskeletal: No aggressive appearing focal osseous lesions. Symmetric mild gynecomastia is unchanged. Marked thoracic spondylosis. IMPRESSION: 1. No significant extracardiac findings. 2. Chronic moderate elevation of the left hemidiaphragm. 3.  Aortic Atherosclerosis (ICD10-I70.0). Electronically Signed   By: Frederick DELENA Blue M.D.   On: 02/09/2024 13:09   Result Date: 02/09/2024 CLINICAL DATA:  CP EXAM: Cardiac/Coronary  CTA TECHNIQUE: The patient was scanned on a GE Apex scanner. FINDINGS: A 120 kV prospective scan was triggered in the descending thoracic aorta at 111 HU's. Axial non-contrast 3 mm slices were carried out through the heart. The data set was analyzed on a dedicated work station and scored using the Agatson method. Gantry rotation speed was 250 msecs and collimation was .6  mm. No beta blockade and 0.8 mg of sl NTG was given. The 3D data set was reconstructed at 48% of the R-R cycle. Diastolic phases were analyzed on a dedicated work station using MPR, MIP and VRT modes. The patient received 80 cc of contrast. Aorta: Normal size.  No calcifications.  No dissection. Aortic Valve:  Trileaflet.  No calcifications. Coronary Arteries:  Normal coronary origin.  Right dominance. RCA is a large dominant artery that gives rise to PDA and PLA. There is no plaque. Left main is a large artery that gives rise to LAD and LCX arteries. LAD is a large vessel that has soft plaque in the distal porion with mild stenosis of 25-49%. This artery gives rise to small D1 and D2. LCX is a non-dominant artery that gives rise to one large OM1 branch. There is no plaque. Other findings: Normal pulmonary vein drainage into the left atrium. Normal left atrial appendage without a thrombus. Normal size of the pulmonary artery. IMPRESSION: 1. Coronary calcium score of 23. This was 10 percentile for age and sex matched control. 2. Normal coronary origin with right dominance. 3. CAD-RADS 2. 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 Consider non-atherosclerotic causes of chest pain. Consider preventive therapy and risk factor modification. Electronically Signed: By: Lamar Fitch M.D. On: 02/07/2024 20:35    Disposition: Discharge disposition: 01-Home or Self Care          Follow-up Information     Vernetta Lonni GRADE, MD Follow up in 2 week(s).   Specialty: Orthopedic Surgery Contact information: [address] [place]  [place] [address] [phone]         Joshua Francisco, MD Follow up.   Specialty: Family Medicine Contact information: [address] [place] [address] [phone]                  Signed: Lonni GRADE Vernetta 02/28/2024, 2:33 PM

## 2024-02-28 NOTE — TOC Transition Note (Addendum)
 Transition of Care [facility]) - Discharge Note   Patient Details  Name: Frederick Harris MRN: [id number] Date of Birth: 07-10-1950  [facility] CM/SW Contact:  Rosalva Jon Bloch, RN Phone Number: 02/28/2024, 2:00 PM   Clinical Narrative:    Patient will DC to: Home Anticipated DC date: 02/28/2024 Family notified: yes Transport by: car        - s/p R THA   Per MD patient ready for DC today . RN, patient, patient's  wife, and [facility] notified of DC. Wife to assist with care once home.  Referral made with [employer] for DME : BSC, pt without provider preference. Post hospital f/u noted on AVS. Pt without RX med concerns or transportation issues. Wife to provide transportation to home.  RNCM will sign off for now as intervention is no longer needed. Please consult us  again if new needs arise.    Final next level of care: Home w Home Health Services Barriers to Discharge: No Barriers Identified   Patient Goals and CMS Choice     Choice offered to / list presented to : Patient      Discharge Placement                       Discharge Plan and Services Additional resources added to the After Visit Summary for                  DME Arranged: Bedside commode DME Agency: [employer] Date DME Agency Contacted: 02/28/24 Time DME Agency Contacted: [phone] Representative spoke with at DME Agency: [place] [facility] Arranged: PT HH Agency: [facility] (PREARRANGED BY PROVIDER'S OFFICE)        Social Drivers of Health (SDOH) Interventions SDOH Screenings   Food Insecurity: No Food Insecurity (02/27/2024)  Housing: Low Risk  (02/27/2024)  Transportation Needs: No Transportation Needs (02/27/2024)  Utilities: Not At Risk (02/27/2024)  Social Connections: Patient Declined (02/27/2024)  Tobacco Use: Low Risk  (02/27/2024)     Readmission Risk Interventions     No data to display

## 2024-02-28 NOTE — Progress Notes (Signed)
 Physical Therapy Treatment Patient Details Name: Frederick Harris MRN: [id number] DOB: 01-Nov-1950 Today's Date: 02/28/2024   History of Present Illness Pt is a 73 y.o. male who presented 10/21 for elective direct anterior R THA. PMH: CHF, DM, HTN, PE    PT Comments  Focused second session this date on progressing gait distance and on gait training. The pt needed cues to stand upright and clear his R foot when stepping. He needs reminders to remain proximal within his RW as well. He was able to progress to ambulating up to ~90 ft before needing to sit to rest. Educated wife on ways she can assist pt with bed mobility and stairs. Educated them on taking >/= x3 longer walks/day to tolerance and complying with HEP handout 2-3x/day as well. Educated them on technique for car transfers. They verbalized understanding. Will continue to follow acutely.     If plan is discharge home, recommend the following: A lot of help with bathing/dressing/bathroom;Assistance with cooking/housework;Assist for transportation;Help with stairs or ramp for entrance;A little help with walking and/or transfers   Can travel by private vehicle        Equipment Recommendations  BSC/3in1    Recommendations for Other Services       Precautions / Restrictions Precautions Precautions: Fall Restrictions Weight Bearing Restrictions Per Provider Order: Yes RLE Weight Bearing Per Provider Order: Weight bearing as tolerated     Mobility  Bed Mobility Overal bed mobility: Needs Assistance Bed Mobility: Supine to Sit     Supine to sit: Min assist, HOB elevated, Used rails Sit to supine: Min assist, Used rails, HOB elevated   General bed mobility comments: MinA needed to bring R leg off bed, pt using bed rails to manage trunk and scoot to EOB    Transfers Overall transfer level: Needs assistance Equipment used: Rolling walker (2 wheels) Transfers: Sit to/from Stand Sit to Stand: Contact guard assist, From elevated  surface   Step pivot transfers: Contact guard assist       General transfer comment: Pt pushing up from elevated EOB with bil hands, mild instability transitioning hands to RW, CGA for safety    Ambulation/Gait Ambulation/Gait assistance: Contact guard assist, Supervision Gait Distance (Feet): 90 Feet Assistive device: Rolling walker (2 wheels) Gait Pattern/deviations: Step-to pattern, Decreased stance time - right, Decreased stride length, Decreased weight shift to right, Antalgic, Trunk flexed, Step-through pattern Gait velocity: reduced Gait velocity interpretation: <1.31 ft/sec, indicative of household ambulator   General Gait Details: Pt takes slow, small, antalgic steps. Cues needed to remain proximal within RW and stand upright. Cues also provided to take longer steps with R leg and march R leg to clear foot. No LOB, CGA-supervision for safety.   Stairs Stairs: Yes Stairs assistance: Contact guard assist, Min assist Stair Management: One rail Left, One rail Right, Forwards, Step to pattern, Sideways Number of Stairs: 2 General stair comments: Educated pt to lead up with his good (L) leg and down with his bad (R) leg. Ascends with bil hands on L rail, semi turned sideways, leading up with L leg as cued, CGA for safety, needing cues to shift weight anteriorly to power up each step, noted pt leans back to hike L foot up to next step. Descends forward with R hand on R rail and L hand on therapist, minA for balance, preferring to descend leading with L leg instead.   Wheelchair Mobility     Tilt Bed    Modified Rankin (Stroke Patients Only)  Balance Overall balance assessment: Needs assistance Sitting-balance support: No upper extremity supported, Feet supported Sitting balance-Leahy Scale: Fair     Standing balance support: Bilateral upper extremity supported, During functional activity, Reliant on assistive device for balance, No upper extremity  supported Standing balance-Leahy Scale: Fair Standing balance comment: briefly able to stand statically without UE support, relies on RW to ambulate                            Communication Communication Communication: No apparent difficulties  Cognition Arousal: Alert Behavior During Therapy: WFL for tasks assessed/performed   PT - Cognitive impairments: No apparent impairments                         Following commands: Intact      Cueing Cueing Techniques: Verbal cues  Exercises Total Joint Exercises Ankle Circles/Pumps: AROM, Both, 10 reps, Supine Short Arc Quad: AROM, Strengthening, Both, 10 reps, Supine Heel Slides: AAROM, Right, 10 reps, Supine Hip ABduction/ADduction: AAROM, Strengthening, Right, 10 reps, Supine (verbally reviewed standing exercise) Long Arc Quad: AROM, Right, 10 reps, Seated Marching in Standing:  (verbally reviewed) Standing Hip Extension:  (verbally reviewed)    General Comments General comments (skin integrity, edema, etc.): Educated wife on ways she can assist pt with bed mobility and stairs. Educated them on taking >/= x3 longer walks/day to tolerance and complying with HEP handout 2-3x/day as well. They verbalized understanding      Pertinent Vitals/Pain Pain Assessment Pain Assessment: Faces Faces Pain Scale: Hurts even more Pain Location: R hip Pain Descriptors / Indicators: Discomfort, Grimacing, Guarding, Operative site guarding, Numbness Pain Intervention(s): Limited activity within patient's tolerance, Monitored during session, Repositioned, Ice applied    Home Living                          Prior Function            PT Goals (current goals can now be found in the care plan section) Acute Rehab PT Goals Patient Stated Goal: to improve PT Goal Formulation: With patient/family Time For Goal Achievement: 03/05/24 Potential to Achieve Goals: Good Progress towards PT goals: Progressing toward  goals    Frequency    7X/week      PT Plan      Co-evaluation              AM-PAC PT 6 Clicks Mobility   Outcome Measure  Help needed turning from your back to your side while in a flat bed without using bedrails?: A Little Help needed moving from lying on your back to sitting on the side of a flat bed without using bedrails?: A Little Help needed moving to and from a bed to a chair (including a wheelchair)?: A Little Help needed standing up from a chair using your arms (e.g., wheelchair or bedside chair)?: A Little Help needed to walk in hospital room?: A Little Help needed climbing 3-5 steps with a railing? : A Little 6 Click Score: 18    End of Session Equipment Utilized During Treatment: Gait belt Activity Tolerance: Patient tolerated treatment well Patient left: with call bell/phone within reach;in chair;with chair alarm set;with family/visitor present Nurse Communication: Mobility status;Other (comment) (BP stable) PT Visit Diagnosis: Unsteadiness on feet (R26.81);Other abnormalities of gait and mobility (R26.89);Muscle weakness (generalized) (M62.81);Difficulty in walking, not elsewhere classified (R26.2);Pain Pain - Right/Left: Right Pain -  part of body: Hip     Time: 1201-1216 PT Time Calculation (min) (ACUTE ONLY): 15 min  Charges:    $Gait Training: 8-22 mins PT General Charges $$ ACUTE PT VISIT: 1 Visit                     Theo Ferretti, PT, DPT Acute Rehabilitation Services  Office: [phone]    Theo CHRISTELLA Ferretti 02/28/2024, 12:27 PM

## 2024-02-28 NOTE — Discharge Instructions (Signed)

## 2024-03-07 ENCOUNTER — Other Ambulatory Visit: Payer: Self-pay | Admitting: Hematology and Oncology

## 2024-03-07 DIAGNOSIS — I2699 Other pulmonary embolism without acute cor pulmonale: Secondary | ICD-10-CM

## 2024-03-08 ENCOUNTER — Inpatient Hospital Stay: Attending: Hematology and Oncology | Admitting: Hematology and Oncology

## 2024-03-08 ENCOUNTER — Other Ambulatory Visit (HOSPITAL_COMMUNITY): Payer: Self-pay

## 2024-03-08 ENCOUNTER — Inpatient Hospital Stay

## 2024-03-08 ENCOUNTER — Encounter: Payer: Self-pay | Admitting: Hematology and Oncology

## 2024-03-08 VITALS — BP 152/68 | HR 99 | Resp 18 | Ht 70.0 in | Wt 208.6 lb

## 2024-03-08 DIAGNOSIS — Z7901 Long term (current) use of anticoagulants: Secondary | ICD-10-CM | POA: Diagnosis not present

## 2024-03-08 DIAGNOSIS — I82412 Acute embolism and thrombosis of left femoral vein: Secondary | ICD-10-CM | POA: Insufficient documentation

## 2024-03-08 DIAGNOSIS — D500 Iron deficiency anemia secondary to blood loss (chronic): Secondary | ICD-10-CM | POA: Diagnosis not present

## 2024-03-08 DIAGNOSIS — I2699 Other pulmonary embolism without acute cor pulmonale: Secondary | ICD-10-CM | POA: Insufficient documentation

## 2024-03-08 LAB — CBC WITH DIFFERENTIAL (CANCER CENTER ONLY)
Abs Immature Granulocytes: 0.28 K/uL — ABNORMAL HIGH (ref 0.00–0.07)
Basophils Absolute: 0.1 K/uL (ref 0.0–0.1)
Basophils Relative: 1 %
Eosinophils Absolute: 0.2 K/uL (ref 0.0–0.5)
Eosinophils Relative: 2 %
HCT: 27.4 % — ABNORMAL LOW (ref 39.0–52.0)
Hemoglobin: 9.3 g/dL — ABNORMAL LOW (ref 13.0–17.0)
Immature Granulocytes: 3 %
Lymphocytes Relative: 16 %
Lymphs Abs: 1.7 K/uL (ref 0.7–4.0)
MCH: 28.3 pg (ref 26.0–34.0)
MCHC: 33.9 g/dL (ref 30.0–36.0)
MCV: 83.3 fL (ref 80.0–100.0)
Monocytes Absolute: 0.7 K/uL (ref 0.1–1.0)
Monocytes Relative: 7 %
Neutro Abs: 7.7 K/uL (ref 1.7–7.7)
Neutrophils Relative %: 71 %
Platelet Count: 462 K/uL — ABNORMAL HIGH (ref 150–400)
RBC: 3.29 MIL/uL — ABNORMAL LOW (ref 4.22–5.81)
RDW: 13.9 % (ref 11.5–15.5)
WBC Count: 10.5 K/uL (ref 4.0–10.5)
nRBC: 0 % (ref 0.0–0.2)

## 2024-03-08 LAB — BASIC METABOLIC PANEL - CANCER CENTER ONLY
Anion gap: 8 (ref 5–15)
BUN: 21 mg/dL (ref 8–23)
CO2: 28 mmol/L (ref 22–32)
Calcium: 9.1 mg/dL (ref 8.9–10.3)
Chloride: 100 mmol/L (ref 98–111)
Creatinine: 0.86 mg/dL (ref 0.61–1.24)
GFR, Estimated: >60 mL/min (ref >60)
Glucose, Bld: 170 mg/dL — ABNORMAL HIGH (ref 70–99)
Potassium: 4 mmol/L (ref 3.5–5.1)
Sodium: 136 mmol/L (ref 135–145)

## 2024-03-08 MED ORDER — APIXABAN 5 MG PO TABS
5.0000 mg | ORAL_TABLET | Freq: Two times a day (BID) | ORAL | 6 refills | Status: AC
  Filled 2024-03-08 – 2024-03-25 (×2): qty 60, 30d supply, fill #0
  Filled 2024-05-08: qty 60, 30d supply, fill #1

## 2024-03-08 MED ORDER — APIXABAN 5 MG PO TABS: 5.0000 mg | ORAL_TABLET | Freq: Two times a day (BID) | ORAL | 6 refills | Status: DC

## 2024-03-08 NOTE — Assessment & Plan Note (Addendum)
 He appears to tolerate recent surgery well He will continue his anticoagulation therapy

## 2024-03-08 NOTE — Assessment & Plan Note (Addendum)
 He has developed anemia since surgery I recommend oral iron supplement

## 2024-03-08 NOTE — Progress Notes (Signed)
 [facility] OFFICE PROGRESS NOTE  Patient Care Team: Joshua Francisco, MD as PCP - General (Family Medicine) Kate Lonni CROME, MD as PCP - Cardiology (Cardiology)  Assessment & Plan Acute pulmonary embolism without acute cor pulmonale, unspecified pulmonary embolism type [name]) He appears to tolerate recent surgery well He will continue his anticoagulation therapy Anemia, blood loss He has developed anemia since surgery I recommend oral iron supplement  No orders of the defined types were placed in this encounter.    Almarie Bedford, MD  INTERVAL HISTORY: he returns for surveillance follow-up for history of DVT/PE Patient denies recent bleeding such as epistaxis, hematuria or hematochezia He tolerated recent surgery well He was briefly constipated after surgery We reviewed medication list and discussed medication changes We discussed test results and future plan of care as outlined above  PHYSICAL EXAMINATION: ECOG PERFORMANCE STATUS: 1 - Symptomatic but completely ambulatory  Vitals:   03/08/24 1443  BP: (!) 152/68  Pulse: 99  Resp: 18  SpO2: 100%   Lab Results  Component Value Date   WBC 10.5 03/08/2024   HGB 9.3 (L) 03/08/2024   HCT 27.4 (L) 03/08/2024   MCV 83.3 03/08/2024   PLT 462 (H) 03/08/2024    SUMMARY OF HEMATOLOGIC HISTORY:  Frederick Harris 73 y.o. male is here because of recent diagnosis of DVT and PE He is here accompanied by Frederick Harris, his wife The patient is semiretired He has severe osteoarthritis affecting his mobility The patient had multiple cardiovascular risk factors  Approximately a year ago, he fell on his left leg but did not receive any further evaluation He was admitted to the emergency department a month ago when he presented with right sided mid pain and some pleuritic chest pain He had evaluation including venous Doppler ultrasound as well as CT pulmonary angiogram  On 07/06/2023, venous Doppler ultra sound showed acute deep  vein thrombosis involving the left femoral vein and age indeterminate deep vein thrombosis involving the left posterior tibial veins as well as age indeterminate superficial vein thrombosis involving the left small saphenous vein  CT pulmonary angiogram from 07/05/2023 showed small filling defect in the right lower lobe compatible with small nonocclusive pulmonary embolus  Echocardiogram performed on 07/05/2023 show reduced ejection fraction estimate 40 to 50% with global hypokinesis, mildly enlarged right ventricle  The patient received anticoagulation therapy and was discharged The patient denies any recent signs or symptoms of bleeding such as spontaneous epistaxis, hematuria or hematochezia.  He denies prior diagnosis of blood clot  He denies lower extremity swelling, warmth, tenderness & erythema.  He denies shortness of breath on minimal exertion, pre-syncopal episodes, hemoptysis, or palpitation. He denies recent  history of trauma, long distance travel, dehydration, recent surgery, smoking or prolonged immobilization.  He has severe osteoarthritis impairing his mobility on the right leg He had no prior history or diagnosis of cancer. His age appropriate screening programs are up-to-date. He had prior surgeries before and never had perioperative thromboembolic events. The patient had never used testosterone replacement therapy  There is strong family history of blood clots.  He has 12 other siblings, for siblings had blood clot diagnosis On average, he drinks approximately 64 ounces of liquid per day

## 2024-03-11 ENCOUNTER — Ambulatory Visit: Admitting: Physician Assistant

## 2024-03-11 ENCOUNTER — Encounter: Payer: Self-pay | Admitting: Physician Assistant

## 2024-03-11 ENCOUNTER — Encounter: Payer: Self-pay | Admitting: Radiology

## 2024-03-11 DIAGNOSIS — Z96641 Presence of right artificial hip joint: Secondary | ICD-10-CM

## 2024-03-11 MED ORDER — OXYCODONE HCL 5 MG PO TABS: 5.0000 mg | ORAL_TABLET | Freq: Four times a day (QID) | ORAL | 0 refills | Status: AC | PRN

## 2024-03-11 NOTE — Progress Notes (Signed)
 HPI: Mr. Stachnik returns today status post right total hip arthroplasty 02/27/2024.  He states he is overall doing well some pain in the hip.  He is taking oxycodone and occasional Zanaflex along with Tylenol .  He is on chronic Eliquis .  He has had no fevers chills.  He is ambulating with a rolling walker.  Physical exam: Right hip: Surgical incisions well-approximated with staples no signs of infection.  No wound dehiscence.  Good range of motion of the right hip without pain.  Right calf soft nontender dorsiflexion plantarflexion right ankle intact.  Impression: Status post right total hip arthroplasty  Plan: Recommend he continue to walk for exercise.  No abduction exercises.  Staples harvested Steri-Strips applied.  Scar tissue mobilization encouraged.  Follow-up with us  in 1 month sooner if there is any questions concerns.

## 2024-03-25 ENCOUNTER — Other Ambulatory Visit: Payer: Self-pay

## 2024-03-25 ENCOUNTER — Other Ambulatory Visit (HOSPITAL_COMMUNITY): Payer: Self-pay

## 2024-04-10 ENCOUNTER — Other Ambulatory Visit: Payer: Self-pay | Admitting: Radiology

## 2024-04-10 ENCOUNTER — Encounter: Payer: Self-pay | Admitting: Orthopaedic Surgery

## 2024-04-10 ENCOUNTER — Ambulatory Visit: Admitting: Physician Assistant

## 2024-04-10 DIAGNOSIS — Z96641 Presence of right artificial hip joint: Secondary | ICD-10-CM

## 2024-04-10 MED ORDER — AMOXICILLIN 500 MG PO TABS: 500.0000 mg | ORAL_TABLET | ORAL | 1 refills | Status: AC

## 2024-04-10 NOTE — Progress Notes (Signed)
 HPI: Mr. Brenita returns today now 6 weeks status post right total hip arthroplasty.  He states he has no pain in the hip.  However he states his gait and balance are still somewhat off and he continues to use a cane.  He also he states his range of motion is not where he worn it.  Otherwise he is doing well.  He is on chronic anticoagulation.  States that his incisions healed well.  Review of systems: See HPI otherwise negative  Physical exam: General Well-developed well-nourished male no acute distress mood and affect appropriate.  Psych alert and oriented x 3.  He walks with an antalgic gait but is able to walk from being seated in a chair to the table without an assistive device. Right hip good range of motion with internal/external rotation without pain.  Right calf supple nontender dorsiflexion plantarflexion right ankle intact.  He has limited hip flexion bilaterally.  Impression: Status post right total hip arthroplasty 02/27/2024  Plan: Will send her to physical therapy for gait and balance range of motion strengthening right hip he will follow-up with us  in 4 weeks see how he is doing overall.  Questions were encouraged and answered at length.

## 2024-04-10 NOTE — Addendum Note (Signed)
 Addended by: GRETTA FORTE on: 04/10/2024 11:52 AM   Modules accepted: Orders

## 2024-04-24 ENCOUNTER — Telehealth: Payer: Self-pay | Admitting: Orthopaedic Surgery

## 2024-04-24 NOTE — Telephone Encounter (Signed)
 Patient called. He would like a note to return to work. Email it to [email or web address]

## 2024-04-24 NOTE — Telephone Encounter (Signed)
 done

## 2024-05-06 ENCOUNTER — Ambulatory Visit (HOSPITAL_BASED_OUTPATIENT_CLINIC_OR_DEPARTMENT_OTHER): Admitting: Cardiology

## 2024-05-08 ENCOUNTER — Ambulatory Visit: Admitting: Orthopaedic Surgery

## 2024-05-08 ENCOUNTER — Encounter: Payer: Self-pay | Admitting: Physical Therapy

## 2024-05-08 ENCOUNTER — Ambulatory Visit: Admitting: Physical Therapy

## 2024-05-08 DIAGNOSIS — M25551 Pain in right hip: Secondary | ICD-10-CM | POA: Diagnosis present

## 2024-05-08 DIAGNOSIS — M25651 Stiffness of right hip, not elsewhere classified: Secondary | ICD-10-CM | POA: Insufficient documentation

## 2024-05-08 DIAGNOSIS — R262 Difficulty in walking, not elsewhere classified: Secondary | ICD-10-CM | POA: Insufficient documentation

## 2024-05-08 DIAGNOSIS — Z96641 Presence of right artificial hip joint: Secondary | ICD-10-CM | POA: Diagnosis not present

## 2024-05-08 NOTE — Therapy (Signed)
 " OUTPATIENT PHYSICAL THERAPY LOWER EXTREMITY EVALUATION   Patient Name: Frederick Harris MRN: [id number] DOB:1950/09/16, 73 y.o., male Today's Date: 05/08/2024  END OF SESSION:  PT End of Session - 05/08/24 1217     Visit Number 1    Date for Recertification  08/06/24    Authorization Type Medicare    PT Start Time 1216    PT Stop Time 1300    PT Time Calculation (min) 44 min    Activity Tolerance Patient tolerated treatment well    Behavior During Therapy [facility] for tasks assessed/performed          Past Medical History:  Diagnosis Date   CHF (congestive heart failure) (HCC)    Diabetes mellitus without complication (HCC)    Hypertension    Pulmonary embolism (HCC) 07/05/2023   Past Surgical History:  Procedure Laterality Date   EYE SURGERY Right    HAND TENDON SURGERY Right    middle finger   TOTAL HIP ARTHROPLASTY Right 02/27/2024   Procedure: ARTHROPLASTY, HIP, TOTAL, ANTERIOR APPROACH;  Surgeon: Vernetta Lonni GRADE, MD;  Location: [facility] OR;  Service: Orthopedics;  Laterality: Right;   Patient Active Problem List   Diagnosis Date Noted   Anemia, blood loss 03/08/2024   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/27/2024   Chronic heart failure with mildly reduced ejection fraction (HFmrEF, 41-49%) (HCC) 08/01/2023   Acute pulmonary embolism (HCC) 07/05/2023   HTN (hypertension) 07/05/2023   Nuclear sclerotic cataract of left eye 02/03/2021   Peripheral pterygium, stationary, left 01/28/2020   Macular hole of right eye 01/28/2020   Retinal detachment of right eye with single break 01/28/2020   Posterior vitreous detachment of left eye 01/28/2020   Diabetes mellitus without complication (HCC) 01/28/2020   Unilateral primary osteoarthritis, right hip 08/21/2019   Unilateral primary osteoarthritis, left hip 03/09/2017    PCP: Aurora Molt, MD  REFERRING PROVIDER: Tory Gaskins, PA  REFERRING DIAG: S/P right THA  THERAPY DIAG:  Pain in right hip  Hip stiffness,  right  Difficulty in walking, not elsewhere classified  Rationale for Evaluation and Treatment: Rehabilitation  ONSET DATE: 02/27/24  SUBJECTIVE:   SUBJECTIVE STATEMENT: Patient reports that he had a right THA anterior approach on 02/27/24, reports he had 5 days of home PT.  Reports that he is weak in the knees  PERTINENT HISTORY: HTN, DM, CHF PAIN:  Are you having pain? Yes: NPRS scale: o/10  Pain location: right hip Pain description: ache sore Aggravating factors: grocery shopping, pain can be up to 5/10 Relieving factors: rest  PRECAUTIONS: None  RED FLAGS: None   WEIGHT BEARING RESTRICTIONS: No  FALLS:  Has patient fallen in last 6 months? 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  DIAGNOSTIC FINDINGS: none   COGNITION: Overall cognitive status: Within functional limits for tasks assessed     SENSATION: WFL  EDEMA:  Some swelling in the right hip and thigh  MUSCLE LENGTH: Some tightness  POSTURE: rounded shoulders, forward head, and decreased  lumbar lordosis  PALPATION: Very tight in the hip and the back mms, denies pain  LOWER EXTREMITY ROM:  cannot raise into hip flexion bilaterally in sitting  Active ROM Right eval Left eval  Hip flexion 40 60  Hip extension 5   Hip abduction 10   Hip adduction    Hip internal rotation    Hip external rotation    Knee flexion    Knee extension    Ankle dorsiflexion    Ankle plantarflexion    Ankle inversion     Ankle eversion     (Blank rows = not tested)  LOWER EXTREMITY MMT:  MMT Right eval Left eval  Hip flexion 3   Hip extension 3+   Hip abduction 3   Hip adduction    Hip internal rotation    Hip external rotation    Knee flexion 4+ 4-  Knee extension 4+ 4+  Ankle dorsiflexion 4+ 3+  Ankle plantarflexion    Ankle inversion    Ankle eversion     (Blank rows = not tested)   FUNCTIONAL TESTS:  5 times sit to stand: cannot stand without hands  Timed up and go (TUG): 33 seconds with SPC 3 minute walk test: 200 feet with SPC Has to use hands to pick up legs to put on shoes, get in and out of the truck  GAIT: Distance walked: 200 feet Assistive device utilized: Single point cane Level of assistance: SBA Comments: very slow, poor posture, fatigued                                                                                                                                TREATMENT DATE:  05/08/24 Evaluation Nustep level 5 x 5 minutes Leg press 20# x10, 40# x10, then single legs 20# small ROM   PATIENT EDUCATION:  Education details: POC Person educated: Patient Education method: Explanation Education comprehension: verbalized understanding  HOME EXERCISE PROGRAM: TBD  ASSESSMENT:  CLINICAL IMPRESSION: Patient is a 73 y.o. male who was seen today for physical therapy evaluation and treatment for s/p right THA anterior approach, he is very slow with his gait using a SPC.  He cannot get up from sitting without using hands, he cannot raise either hip without using his hands, very poor hip flexors. Poor glutes, poor abs, and the left DF is very weak.  3 MWT was 200 feet very slow and fatigued  OBJECTIVE IMPAIRMENTS: Abnormal gait, cardiopulmonary status limiting activity, decreased activity tolerance, decreased balance, decreased coordination, decreased endurance, decreased mobility, difficulty walking, decreased ROM, decreased strength, increased edema, increased muscle  spasms, impaired flexibility, improper body mechanics, postural dysfunction, and pain.    REHAB POTENTIAL: Good  CLINICAL DECISION MAKING: Evolving/moderate complexity  EVALUATION COMPLEXITY: Moderate   GOALS: Goals reviewed with patient? Yes  SHORT TERM GOALS: Target date: 06/02/24 Independent with initial HEP Baseline: Goal status: INITIAL   LONG TERM GOALS: Target date: 08/06/24  Independent with advanced HEP Baseline:  Goal status: INITIAL  2.  Improve to 500 feet Baseline: 200 feet Goal status: INITIAL  3.  Improve TUG to 25 seconds Baseline: cannot get up from sitting without using hands Goal status: INITIAL  4.  Improve active hip flexion in standing to 90 degrees Baseline: 40 degrees Goal status: INITIAL  5.  Be able to get in the car wihtout lifting his legs with his hands Baseline:  Goal status: INITIAL  6.  Walk 400 feet without assistive device Baseline:  Goal status: INITIAL   PLAN:  PT FREQUENCY: 2x/week  PT DURATION: 12 weeks  PLANNED INTERVENTIONS: 97164- PT Re-evaluation, 97110-Therapeutic exercises, 97530- Therapeutic activity, 97112- Neuromuscular re-education, 97535- Self Care, [id number]- Manual therapy, [phone]- Gait training, [phone]- Electrical stimulation (unattended), Patient/Family education, Balance training, Stair training, Taping, Joint mobilization, Cryotherapy, and Moist heat  PLAN FOR NEXT SESSION: gym, HEP   Arihanna Estabrook W, PT 05/08/2024, 12:18 PM  "

## 2024-05-10 ENCOUNTER — Other Ambulatory Visit (HOSPITAL_COMMUNITY): Payer: Self-pay

## 2024-05-14 ENCOUNTER — Ambulatory Visit: Attending: Physician Assistant | Admitting: Physical Therapy

## 2024-05-14 ENCOUNTER — Encounter: Payer: Self-pay | Admitting: Physical Therapy

## 2024-05-14 DIAGNOSIS — M25551 Pain in right hip: Secondary | ICD-10-CM | POA: Insufficient documentation

## 2024-05-14 DIAGNOSIS — R262 Difficulty in walking, not elsewhere classified: Secondary | ICD-10-CM | POA: Diagnosis present

## 2024-05-14 DIAGNOSIS — M25651 Stiffness of right hip, not elsewhere classified: Secondary | ICD-10-CM | POA: Insufficient documentation

## 2024-05-14 NOTE — Therapy (Signed)
 " OUTPATIENT PHYSICAL THERAPY LOWER EXTREMITY TREATMENT    Patient Name: Frederick Harris MRN: [id number] DOB:12/27/1950, 74 y.o., male Today's Date: 05/14/2024  END OF SESSION:  PT End of Session - 05/14/24 0942     Visit Number 2    Date for Recertification  08/06/24    Authorization Type Medicare    PT Start Time 0932    PT Stop Time 1010    PT Time Calculation (min) 38 min    Activity Tolerance Patient tolerated treatment well    Behavior During Therapy [facility] for tasks assessed/performed           Past Medical History:  Diagnosis Date   CHF (congestive heart failure) (HCC)    Diabetes mellitus without complication (HCC)    Hypertension    Pulmonary embolism (HCC) 07/05/2023   Past Surgical History:  Procedure Laterality Date   EYE SURGERY Right    HAND TENDON SURGERY Right    middle finger   TOTAL HIP ARTHROPLASTY Right 02/27/2024   Procedure: ARTHROPLASTY, HIP, TOTAL, ANTERIOR APPROACH;  Surgeon: Vernetta Lonni GRADE, MD;  Location: [facility] OR;  Service: Orthopedics;  Laterality: Right;   Patient Active Problem List   Diagnosis Date Noted   Anemia, blood loss 03/08/2024   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/27/2024   Chronic heart failure with mildly reduced ejection fraction (HFmrEF, 41-49%) (HCC) 08/01/2023   Acute pulmonary embolism (HCC) 07/05/2023   HTN (hypertension) 07/05/2023   Nuclear sclerotic cataract of left eye 02/03/2021   Peripheral pterygium, stationary, left 01/28/2020   Macular hole of right eye 01/28/2020   Retinal detachment of right eye with single break 01/28/2020   Posterior vitreous detachment of left eye 01/28/2020   Diabetes mellitus without complication (HCC) 01/28/2020   Unilateral primary osteoarthritis, right hip 08/21/2019   Unilateral primary osteoarthritis, left hip 03/09/2017    PCP: Aurora Molt, MD  REFERRING PROVIDER: Tory Gaskins, PA  REFERRING DIAG: S/P right THA  THERAPY DIAG:  Pain in right hip  Hip stiffness,  right  Difficulty in walking, not elsewhere classified  Rationale for Evaluation and Treatment: Rehabilitation  ONSET DATE: 02/27/24  SUBJECTIVE:   SUBJECTIVE STATEMENT:  Things are going well since eval, no questions. Picking leg up is pretty hard, hip flexors are weak.    EVAL: Patient reports that he had a right THA anterior approach on 02/27/24, reports he had 5 days of home PT.  Reports that he is weak in the knees  PERTINENT HISTORY: HTN, DM, CHF PAIN:  Are you having pain? Yes: NPRS scale: 0/10; at worst pain 0/10 in hip   Pain location: right hip Pain description: ache sore Aggravating factors: grocery shopping, pain can be up to 5/10 Relieving factors: rest  PRECAUTIONS: None  RED FLAGS: None   WEIGHT BEARING RESTRICTIONS: No  FALLS:  Has patient fallen in last 6 months? 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  DIAGNOSTIC FINDINGS: none   COGNITION: Overall cognitive status: Within functional limits for  tasks assessed     SENSATION: WFL  EDEMA:  Some swelling in the right hip and thigh  MUSCLE LENGTH: Some tightness  POSTURE: rounded shoulders, forward head, and decreased lumbar lordosis  PALPATION: Very tight in the hip and the back mms, denies pain  LOWER EXTREMITY ROM:  cannot raise into hip flexion bilaterally in sitting  Active ROM Right eval Left eval  Hip flexion 40 60  Hip extension 5   Hip abduction 10   Hip adduction    Hip  internal rotation    Hip external rotation    Knee flexion    Knee extension    Ankle dorsiflexion    Ankle plantarflexion    Ankle inversion    Ankle eversion     (Blank rows = not tested)  LOWER EXTREMITY MMT:  MMT Right eval Left eval  Hip flexion 3   Hip extension 3+   Hip abduction 3   Hip adduction    Hip internal rotation    Hip external rotation    Knee flexion 4+ 4-  Knee extension 4+ 4+  Ankle dorsiflexion 4+ 3+  Ankle plantarflexion    Ankle inversion    Ankle eversion     (Blank rows = not tested)   FUNCTIONAL TESTS:  5 times sit to stand: cannot stand without hands  Timed up and go (TUG): 33 seconds with SPC 3 minute walk test: 200 feet with SPC Has to use hands to pick up legs to put on shoes, get in and out of the truck  GAIT: Distance walked: 200 feet Assistive device utilized: Single point cane Level of assistance: SBA Comments: very slow, poor posture, fatigued                                                                                                                                TREATMENT DATE:   05/14/24  Nustep L5x8 minutes all four extremities seat 13  Bridges + ABD into red TB x12 Sidelying clams red TB x8 B Sidelying R hip flexion no resistance x10  STS from high mat table x8 no UEs  Tandem stance 4x30 seconds alternating Tandem walks in // bars light UE support x4 laps    05/08/24 Evaluation Nustep level 5 x 5 minutes Leg press 20# x10, 40# x10, then single legs 20# small ROM   PATIENT EDUCATION:  Education details: POC Person educated: Patient Education method: Explanation Education comprehension: verbalized understanding  HOME EXERCISE PROGRAM:   Access Code: [email or web address] URL: [email or web address] Date: 05/14/2024 Prepared by: Josette Rough  ASSESSMENT:  CLINICAL IMPRESSION:  Arrives today doing well, main complaint still general weakness in hip flexor as well as B knee musculature. Focused on  functional strengthening this morning and also assigned initial HEP. Will continue to challenge as appropriate, he is still very easily fatigued.     EVAL: Patient is a 74 y.o. male who was seen today for physical therapy  evaluation and treatment for s/p right THA anterior approach, he is very slow with his gait using a SPC.  He cannot get up from sitting without using hands, he cannot raise either hip without using his hands, very poor hip flexors. Poor glutes, poor abs, and the left DF is very weak.  3 MWT was 200 feet very slow and fatigued  OBJECTIVE IMPAIRMENTS: Abnormal gait, cardiopulmonary status limiting activity, decreased activity tolerance, decreased balance, decreased coordination, decreased endurance, decreased mobility, difficulty walking, decreased ROM, decreased strength, increased edema, increased muscle spasms, impaired flexibility, improper body mechanics, postural dysfunction, and pain.    REHAB POTENTIAL: Good  CLINICAL DECISION MAKING: Evolving/moderate complexity  EVALUATION COMPLEXITY: Moderate   GOALS: Goals reviewed with patient? Yes  SHORT TERM GOALS: Target date: 06/02/24 Independent with initial HEP Baseline: Goal status: INITIAL   LONG TERM GOALS: Target date: 08/06/24  Independent with advanced HEP Baseline:  Goal status: INITIAL  2.  Improve to 500 feet Baseline: 200 feet Goal status: INITIAL  3.  Improve TUG to 25 seconds Baseline: cannot get up from sitting without using hands Goal status: INITIAL  4.  Improve active hip flexion in standing to 90 degrees Baseline: 40 degrees Goal status: INITIAL  5.  Be able to get in the car wihtout lifting his legs with his hands Baseline:  Goal status: INITIAL  6.  Walk 400 feet without assistive device Baseline:  Goal status: INITIAL   PLAN:  PT FREQUENCY: 2x/week  PT DURATION: 12 weeks  PLANNED INTERVENTIONS: 97164- PT Re-evaluation, 97110-Therapeutic exercises, 97530- Therapeutic  activity, 97112- Neuromuscular re-education, 97535- Self Care, [id number]- Manual therapy, [phone]- Gait training, [phone]- Electrical stimulation (unattended), Patient/Family education, Balance training, Stair training, Taping, Joint mobilization, Cryotherapy, and Moist heat  PLAN FOR NEXT SESSION: functional strengthening, review/progress HEP PRN, work on balance   Josette Rough, PT, DPT 05/14/2024 10:11 AM  "

## 2024-05-17 ENCOUNTER — Ambulatory Visit: Admitting: Physical Therapy

## 2024-05-17 DIAGNOSIS — R262 Difficulty in walking, not elsewhere classified: Secondary | ICD-10-CM

## 2024-05-17 DIAGNOSIS — M25551 Pain in right hip: Secondary | ICD-10-CM | POA: Diagnosis not present

## 2024-05-17 DIAGNOSIS — M25651 Stiffness of right hip, not elsewhere classified: Secondary | ICD-10-CM

## 2024-05-17 NOTE — Therapy (Signed)
 " OUTPATIENT PHYSICAL THERAPY LOWER EXTREMITY TREATMENT    Patient Name: Frederick Harris MRN: [id number] DOB:1950/08/11, 74 y.o., male Today's Date: 05/17/2024  END OF SESSION:  PT End of Session - 05/17/24 1051     Visit Number 3    Date for Recertification  08/06/24    Authorization Type Medicare    PT Start Time 1050    PT Stop Time 1135    PT Time Calculation (min) 45 min           Past Medical History:  Diagnosis Date   CHF (congestive heart failure) (HCC)    Diabetes mellitus without complication (HCC)    Hypertension    Pulmonary embolism (HCC) 07/05/2023   Past Surgical History:  Procedure Laterality Date   EYE SURGERY Right    HAND TENDON SURGERY Right    middle finger   TOTAL HIP ARTHROPLASTY Right 02/27/2024   Procedure: ARTHROPLASTY, HIP, TOTAL, ANTERIOR APPROACH;  Surgeon: Vernetta Lonni GRADE, MD;  Location: [facility] OR;  Service: Orthopedics;  Laterality: Right;   Patient Active Problem List   Diagnosis Date Noted   Anemia, blood loss 03/08/2024   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/27/2024   Chronic heart failure with mildly reduced ejection fraction (HFmrEF, 41-49%) (HCC) 08/01/2023   Acute pulmonary embolism (HCC) 07/05/2023   HTN (hypertension) 07/05/2023   Nuclear sclerotic cataract of left eye 02/03/2021   Peripheral pterygium, stationary, left 01/28/2020   Macular hole of right eye 01/28/2020   Retinal detachment of right eye with single break 01/28/2020   Posterior vitreous detachment of left eye 01/28/2020   Diabetes mellitus without complication (HCC) 01/28/2020   Unilateral primary osteoarthritis, right hip 08/21/2019   Unilateral primary osteoarthritis, left hip 03/09/2017    PCP: Aurora Molt, MD  REFERRING PROVIDER: Tory Gaskins, PA  REFERRING DIAG: S/P right THA  THERAPY DIAG:  Pain in right hip  Hip stiffness, right  Difficulty in walking, not elsewhere classified  Rationale for Evaluation and Treatment:  Rehabilitation  ONSET DATE: 02/27/24  SUBJECTIVE:   SUBJECTIVE STATEMENT:  Doing okay, no pain just can't lift leg. Amb  in with SPC.    EVAL: Patient reports that he had a right THA anterior approach on 02/27/24, reports he had 5 days of home PT.  Reports that he is weak in the knees  PERTINENT HISTORY: HTN, DM, CHF PAIN:  Are you having pain? Yes: NPRS scale: 0/10; at worst pain 0/10 in hip   Pain location: right hip Pain description: ache sore Aggravating factors: grocery shopping, pain can be up to 5/10 Relieving factors: rest  PRECAUTIONS: None  RED FLAGS: None   WEIGHT BEARING RESTRICTIONS: No  FALLS:  Has patient fallen in last 6 months? 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  DIAGNOSTIC FINDINGS: none   COGNITION: Overall cognitive status: Within functional limits for tasks assessed     SENSATION: WFL  EDEMA:  Some swelling in the right hip and thigh  MUSCLE LENGTH: Some tightness  POSTURE: rounded shoulders, forward head, and decreased lumbar lordosis  PALPATION: Very tight in the hip and the back mms, denies pain  LOWER EXTREMITY ROM:  cannot raise into hip flexion bilaterally in sitting  Active ROM Right eval Left eval  Hip flexion 40 60  Hip extension 5   Hip abduction 10   Hip adduction    Hip internal rotation    Hip external rotation    Knee flexion    Knee extension    Ankle dorsiflexion    Ankle plantarflexion    Ankle inversion     Ankle eversion     (Blank rows = not tested)  LOWER EXTREMITY MMT:  MMT Right eval Left eval  Hip flexion 3   Hip extension 3+   Hip abduction 3   Hip adduction    Hip internal rotation    Hip external rotation    Knee flexion 4+ 4-  Knee extension 4+ 4+  Ankle dorsiflexion 4+ 3+  Ankle plantarflexion    Ankle inversion    Ankle eversion     (Blank rows = not tested)   FUNCTIONAL TESTS:  5 times sit to stand: cannot stand without hands  Timed up and go (TUG): 33 seconds with SPC 3 minute walk test: 200 feet with SPC Has to use hands to pick up legs to put on shoes, get in and out of the truck  GAIT: Distance walked: 200 feet Assistive device utilized: Single point cane Level of assistance: SBA Comments: very slow, poor posture, fatigued                                                                                                                                TREATMENT DATE:   05/17/24 Nustep L 5 1 min then L 3 Resisted gait fwd and back 5 x each then laterally 3 x with SPC and CGA 20 # STS without UE from elevated mat 10 x 4 inch step up with UE fwd and laterally BIL 10 x each 4 inch step stap 10 x each -cuing to not circumduct Red tband BIL SL hip flex,ext and abd 10 x 20# BIL HS curl 2 sets 10, SL 10# Left , 15# RT 10 x each Knee ext 10# 2 sets 10, SL 5# 2 sets 5 lacking full TKE Leg press 40# BIL 2 sets 10, SL   20# 10 x each., toe raises 40# 2 sets 12  05/14/24  Nustep L5x8 minutes all four extremities seat 13  Bridges + ABD into red TB x12 Sidelying clams red TB x8 B Sidelying R hip flexion no resistance x10  STS from high mat table x8 no UEs  Tandem stance 4x30 seconds alternating Tandem walks in // bars light UE support x4 laps    05/08/24 Evaluation Nustep level 5 x 5 minutes Leg press 20# x10, 40# x10, then single legs 20# small ROM   PATIENT EDUCATION:  Education details: POC Person educated: Patient Education method:  Explanation Education comprehension: verbalized understanding  HOME EXERCISE PROGRAM:   Access Code: [email or web address] URL: [email or web address] Date: 05/14/2024 Prepared by: Josette Rough  ASSESSMENT:  CLINICAL IMPRESSION:  Arrives today doing well, still chief complaint still general weakness in hip flexor as well as B knee musculature. Amb with SPC and limited BIL hip flex RT> left . Cued with ex for compensation esp with hip flexion he wants to circumduct and tends to have LE RT > left in ER. Pnt very motivated and works hard   EVAL: Patient is a 74 y.o. male who was seen today for physical therapy evaluation and treatment for s/p right THA anterior approach, he is very slow with his gait using a SPC.  He cannot get up from sitting without using hands, he cannot raise either hip without using his hands, very poor hip flexors. Poor glutes, poor abs, and the left DF is very weak.  3 MWT was 200 feet very slow and fatigued  OBJECTIVE IMPAIRMENTS: Abnormal gait, cardiopulmonary status limiting activity, decreased activity tolerance, decreased balance, decreased coordination, decreased endurance, decreased mobility, difficulty walking, decreased ROM, decreased strength, increased edema, increased muscle spasms, impaired flexibility, improper body mechanics, postural dysfunction, and pain.    REHAB POTENTIAL: Good  CLINICAL DECISION MAKING: Evolving/moderate complexity  EVALUATION COMPLEXITY: Moderate   GOALS: Goals reviewed with patient? Yes  SHORT TERM GOALS: Target date: 06/02/24 Independent with initial HEP Baseline: Goal status: INITIAL   LONG TERM GOALS: Target date: 08/06/24  Independent with advanced HEP Baseline:  Goal status: INITIAL  2.  Improve to 500 feet Baseline: 200 feet Goal status: INITIAL  3.  Improve TUG to 25 seconds Baseline: cannot get up from sitting without using hands Goal status: INITIAL  4.  Improve active hip flexion in  standing to 90 degrees Baseline: 40 degrees Goal status: INITIAL  5.  Be able to get in the car wihtout lifting his legs with his hands Baseline:  Goal status: INITIAL  6.  Walk 400 feet without assistive device Baseline:  Goal status: INITIAL   PLAN:  PT FREQUENCY: 2x/week  PT DURATION: 12 weeks  PLANNED INTERVENTIONS: 97164- PT Re-evaluation, 97110-Therapeutic exercises, 97530- Therapeutic activity, 97112- Neuromuscular re-education, 97535- Self Care, 02859- Manual therapy, [phone]- Gait training, [phone]- Electrical stimulation (unattended), Patient/Family education, Balance training, Stair training, Taping, Joint mobilization, Cryotherapy, and Moist heat  PLAN FOR NEXT SESSION: functional strengthening, review/progress HEP PRN, work on balance   Josette Rough, PT, DPT 05/17/2024 10:52 AM  "

## 2024-05-22 ENCOUNTER — Ambulatory Visit: Admitting: Physical Therapy

## 2024-05-22 ENCOUNTER — Encounter: Payer: Self-pay | Admitting: Physical Therapy

## 2024-05-22 DIAGNOSIS — M25551 Pain in right hip: Secondary | ICD-10-CM | POA: Diagnosis not present

## 2024-05-22 DIAGNOSIS — M25651 Stiffness of right hip, not elsewhere classified: Secondary | ICD-10-CM

## 2024-05-22 DIAGNOSIS — R262 Difficulty in walking, not elsewhere classified: Secondary | ICD-10-CM

## 2024-05-22 NOTE — Therapy (Signed)
 " OUTPATIENT PHYSICAL THERAPY LOWER EXTREMITY TREATMENT    Patient Name: Frederick Harris MRN: [id number] DOB:Sep 23, 1950, 74 y.o., male Today's Date: 05/22/2024  END OF SESSION:  PT End of Session - 05/22/24 1517     Visit Number 4    Date for Recertification  08/06/24    Authorization Type Medicare    PT Start Time 1447    PT Stop Time 1526    PT Time Calculation (min) 39 min    Activity Tolerance Patient tolerated treatment well    Behavior During Therapy [facility] for tasks assessed/performed            Past Medical History:  Diagnosis Date   CHF (congestive heart failure) (HCC)    Diabetes mellitus without complication (HCC)    Hypertension    Pulmonary embolism (HCC) 07/05/2023   Past Surgical History:  Procedure Laterality Date   EYE SURGERY Right    HAND TENDON SURGERY Right    middle finger   TOTAL HIP ARTHROPLASTY Right 02/27/2024   Procedure: ARTHROPLASTY, HIP, TOTAL, ANTERIOR APPROACH;  Surgeon: Vernetta Lonni GRADE, MD;  Location: [facility] OR;  Service: Orthopedics;  Laterality: Right;   Patient Active Problem List   Diagnosis Date Noted   Anemia, blood loss 03/08/2024   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/27/2024   Chronic heart failure with mildly reduced ejection fraction (HFmrEF, 41-49%) (HCC) 08/01/2023   Acute pulmonary embolism (HCC) 07/05/2023   HTN (hypertension) 07/05/2023   Nuclear sclerotic cataract of left eye 02/03/2021   Peripheral pterygium, stationary, left 01/28/2020   Macular hole of right eye 01/28/2020   Retinal detachment of right eye with single break 01/28/2020   Posterior vitreous detachment of left eye 01/28/2020   Diabetes mellitus without complication (HCC) 01/28/2020   Unilateral primary osteoarthritis, right hip 08/21/2019   Unilateral primary osteoarthritis, left hip 03/09/2017    PCP: Aurora Molt, MD  REFERRING PROVIDER: Tory Gaskins, PA  REFERRING DIAG: S/P right THA  THERAPY DIAG:  Pain in right hip  Hip  stiffness, right  Difficulty in walking, not elsewhere classified  Rationale for Evaluation and Treatment: Rehabilitation  ONSET DATE: 02/27/24  SUBJECTIVE:   SUBJECTIVE STATEMENT:  Feeling good since last visit, definitely improving. HEP is going well, make sure I do a little something every day.    EVAL: Patient reports that he had a right THA anterior approach on 02/27/24, reports he had 5 days of home PT.  Reports that he is weak in the knees  PERTINENT HISTORY: HTN, DM, CHF PAIN:  Are you having pain? Yes: NPRS scale: 0/10; at worst pain 0/10 in hip last week   Pain location: right hip Pain description:   Aggravating factors: grocery shopping, pain can be up to 5/10 Relieving factors: rest  PRECAUTIONS: None  RED FLAGS: None   WEIGHT BEARING RESTRICTIONS: No  FALLS:  Has patient fallen in last 6 months? 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  DIAGNOSTIC FINDINGS: none   COGNITION: Overall cognitive  status: Within functional limits for tasks assessed     SENSATION: WFL  EDEMA:  Some swelling in the right hip and thigh  MUSCLE LENGTH: Some tightness  POSTURE: rounded shoulders, forward head, and decreased lumbar lordosis  PALPATION: Very tight in the hip and the back mms, denies pain  LOWER EXTREMITY ROM:  cannot raise into hip flexion bilaterally in sitting  Active ROM Right eval Left eval  Hip flexion 40 60  Hip extension 5   Hip abduction 10    Hip adduction    Hip internal rotation    Hip external rotation    Knee flexion    Knee extension    Ankle dorsiflexion    Ankle plantarflexion    Ankle inversion    Ankle eversion     (Blank rows = not tested)  LOWER EXTREMITY MMT:  MMT Right eval Left eval  Hip flexion 3   Hip extension 3+   Hip abduction 3   Hip adduction    Hip internal rotation    Hip external rotation    Knee flexion 4+ 4-  Knee extension 4+ 4+  Ankle dorsiflexion 4+ 3+  Ankle plantarflexion    Ankle inversion    Ankle eversion     (Blank rows = not tested)   FUNCTIONAL TESTS:  5 times sit to stand: cannot stand without hands  Timed up and go (TUG): 33 seconds with SPC 3 minute walk test: 200 feet with SPC Has to use hands to pick up legs to put on shoes, get in and out of the truck  GAIT: Distance walked: 200 feet Assistive device utilized: Single point cane Level of assistance: SBA Comments: very slow, poor posture, fatigued                                                                                                                                TREATMENT DATE:    05/22/24  Nustep L5x8 minutes seat 13, all four extremities Gait approximately 71ft carrying 5# wt no device, min guard   Forward step ups 4 inch step x10 B Standing marches 0# each LE x20 alternating, U UE support  Standing hip ABD green TB x12 B Mini-squats in bars with green TB above knees x10 cues for wt on heels   Wide tandem on blue foam pad 4x30 seconds alternating   Side steps on blue foam pad x5 laps // bars  One foot on BOSU/other on land 4x30 seconds alternating     05/17/24 Nustep L 5 1 min then L 3 Resisted gait fwd and back 5 x each then laterally 3 x with SPC and CGA 20 # STS without UE from elevated mat 10 x 4 inch step up with UE fwd and laterally BIL 10 x each 4 inch step stap 10 x each -cuing to not circumduct Red tband BIL SL hip flex,ext and abd 10 x 20# BIL HS curl 2  sets 10, SL 10#  Left , 15# RT 10 x each Knee ext 10# 2 sets 10, SL 5# 2 sets 5 lacking full TKE Leg press 40# BIL 2 sets 10, SL   20# 10 x each., toe raises 40# 2 sets 12  05/14/24  Nustep L5x8 minutes all four extremities seat 13  Bridges + ABD into red TB x12 Sidelying clams red TB x8 B Sidelying R hip flexion no resistance x10  STS from high mat table x8 no UEs  Tandem stance 4x30 seconds alternating Tandem walks in // bars light UE support x4 laps    05/08/24 Evaluation Nustep level 5 x 5 minutes Leg press 20# x10, 40# x10, then single legs 20# small ROM   PATIENT EDUCATION:  Education details: POC Person educated: Patient Education method: Explanation Education comprehension: verbalized understanding  HOME EXERCISE PROGRAM:   Access Code: [email or web address] URL: [email or web address] Date: 05/14/2024 Prepared by: Josette Rough  ASSESSMENT:  CLINICAL IMPRESSION:  Arrives today doing well, we continued progressing functional strength and balance as tolerated today. Still having a lot of difficulty with STS transfers due to general weakness. He relays some concerns about his trip to [place]  in July, assured him we have plenty of time to work on function and mobility before that trip.   EVAL: Patient is a 74 y.o. male who was seen today for physical therapy evaluation and treatment for s/p right THA anterior approach, he is very slow with his gait using a SPC.  He cannot get up from sitting without using hands, he cannot raise either hip without using his hands, very poor hip flexors. Poor glutes, poor abs, and the left DF is very weak.  3 MWT was 200 feet very slow and fatigued  OBJECTIVE IMPAIRMENTS: Abnormal gait, cardiopulmonary status limiting activity, decreased activity tolerance, decreased balance, decreased coordination, decreased endurance, decreased mobility, difficulty walking, decreased ROM, decreased strength, increased edema, increased muscle spasms, impaired  flexibility, improper body mechanics, postural dysfunction, and pain.    REHAB POTENTIAL: Good  CLINICAL DECISION MAKING: Evolving/moderate complexity  EVALUATION COMPLEXITY: Moderate   GOALS: Goals reviewed with patient? Yes  SHORT TERM GOALS: Target date: 06/02/24 Independent with initial HEP Baseline: Goal status: INITIAL   LONG TERM GOALS: Target date: 08/06/24  Independent with advanced HEP Baseline:  Goal status: INITIAL  2.  Improve to 500 feet Baseline: 200 feet Goal status: INITIAL  3.  Improve TUG to 25 seconds Baseline: cannot get up from sitting without using hands Goal status: INITIAL  4.  Improve active hip flexion in standing to 90 degrees Baseline: 40 degrees Goal status: INITIAL  5.  Be able to get in the car wihtout lifting his legs with his hands Baseline:  Goal status: INITIAL  6.  Walk 400 feet without assistive device Baseline:  Goal status: INITIAL   PLAN:  PT FREQUENCY: 2x/week  PT DURATION: 12 weeks  PLANNED INTERVENTIONS: 97164- PT Re-evaluation, 97110-Therapeutic exercises, 97530- Therapeutic activity, 97112- Neuromuscular re-education, 97535- Self Care, [id number]- Manual therapy, [phone]- Gait training, [phone]- Electrical stimulation (unattended), Patient/Family education, Balance training, Stair training, Taping, Joint mobilization, Cryotherapy, and Moist heat  PLAN FOR NEXT SESSION: functional strengthening, review/progress HEP PRN, work on balance. Progress STS/work on mechanics here. Could eventually try TM to assess safety as he has one at home he'd like to use   Josette Rough, PT, DPT 05/22/2024 3:28 PM  "

## 2024-05-24 ENCOUNTER — Ambulatory Visit: Admitting: Physical Therapy

## 2024-05-28 ENCOUNTER — Ambulatory Visit: Admitting: Physical Therapy

## 2024-05-28 DIAGNOSIS — R262 Difficulty in walking, not elsewhere classified: Secondary | ICD-10-CM

## 2024-05-28 DIAGNOSIS — M25651 Stiffness of right hip, not elsewhere classified: Secondary | ICD-10-CM

## 2024-05-28 DIAGNOSIS — M25551 Pain in right hip: Secondary | ICD-10-CM

## 2024-05-28 NOTE — Therapy (Signed)
 " OUTPATIENT PHYSICAL THERAPY LOWER EXTREMITY TREATMENT    Patient Name: Frederick Harris MRN: [id number] DOB:13-Jul-1950, 74 y.o., male Today's Date: 05/28/2024  END OF SESSION:  PT End of Session - 05/28/24 1139     Visit Number 5    Date for Recertification  08/06/24    Authorization Type Medicare    PT Start Time 1140    PT Stop Time 1225    PT Time Calculation (min) 45 min            Past Medical History:  Diagnosis Date   CHF (congestive heart failure) (HCC)    Diabetes mellitus without complication (HCC)    Hypertension    Pulmonary embolism (HCC) 07/05/2023   Past Surgical History:  Procedure Laterality Date   EYE SURGERY Right    HAND TENDON SURGERY Right    middle finger   TOTAL HIP ARTHROPLASTY Right 02/27/2024   Procedure: ARTHROPLASTY, HIP, TOTAL, ANTERIOR APPROACH;  Surgeon: Vernetta Lonni GRADE, MD;  Location: [facility] OR;  Service: Orthopedics;  Laterality: Right;   Patient Active Problem List   Diagnosis Date Noted   Anemia, blood loss 03/08/2024   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/27/2024   Chronic heart failure with mildly reduced ejection fraction (HFmrEF, 41-49%) (HCC) 08/01/2023   Acute pulmonary embolism (HCC) 07/05/2023   HTN (hypertension) 07/05/2023   Nuclear sclerotic cataract of left eye 02/03/2021   Peripheral pterygium, stationary, left 01/28/2020   Macular hole of right eye 01/28/2020   Retinal detachment of right eye with single break 01/28/2020   Posterior vitreous detachment of left eye 01/28/2020   Diabetes mellitus without complication (HCC) 01/28/2020   Unilateral primary osteoarthritis, right hip 08/21/2019   Unilateral primary osteoarthritis, left hip 03/09/2017    PCP: Aurora Molt, MD  REFERRING PROVIDER: Tory Gaskins, PA  REFERRING DIAG: S/P right THA  THERAPY DIAG:  Pain in right hip  Hip stiffness, right  Difficulty in walking, not elsewhere classified  Rationale for Evaluation and Treatment:  Rehabilitation  ONSET DATE: 02/27/24  SUBJECTIVE:   SUBJECTIVE STATEMENT:  Amb in with SPC, longer steps but still slow gait speed. No pain and feels like he is getting stronger. Still unable to lift RT leg   EVAL: Patient reports that he had a right THA anterior approach on 02/27/24, reports he had 5 days of home PT.  Reports that he is weak in the knees  PERTINENT HISTORY: HTN, DM, CHF PAIN:  Are you having pain? Yes: NPRS scale: 0/10; at worst pain 0/10 in hip last week   Pain location: right hip Pain description:   Aggravating factors: grocery shopping, pain can be up to 5/10 Relieving factors: rest  PRECAUTIONS: None  RED FLAGS: None   WEIGHT BEARING RESTRICTIONS: No  FALLS:  Has patient fallen in last 6 months? 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  DIAGNOSTIC FINDINGS: none   COGNITION: Overall cognitive status: Within functional limits for tasks assessed     SENSATION: [facility]  EDEMA:  Some swelling in the right hip and thigh  MUSCLE LENGTH: Some tightness  POSTURE: rounded shoulders, forward head, and decreased lumbar lordosis  PALPATION: Very tight in the hip and the back mms, denies pain  LOWER EXTREMITY ROM:  cannot raise into hip flexion bilaterally in sitting  Active ROM Right eval Left eval  Hip flexion 40 60  Hip extension 5   Hip abduction 10   Hip adduction    Hip internal rotation    Hip external rotation    Knee flexion    Knee  extension    Ankle dorsiflexion    Ankle plantarflexion    Ankle inversion    Ankle eversion     (Blank rows = not tested)  LOWER EXTREMITY MMT:  MMT Right eval Left eval  Hip flexion 3   Hip extension 3+   Hip abduction 3   Hip adduction    Hip internal rotation    Hip external rotation    Knee flexion 4+ 4-  Knee extension 4+ 4+  Ankle dorsiflexion 4+ 3+  Ankle plantarflexion    Ankle inversion    Ankle eversion     (Blank rows = not tested)   FUNCTIONAL TESTS:  5 times sit to stand: cannot stand without hands  Timed up and go (TUG): 33 seconds with SPC 3 minute walk test: 200 feet with SPC Has to use hands to pick up legs to put on shoes, get in and out of the truck  GAIT: Distance walked: 200 feet Assistive device utilized: Single point cane Level of assistance: SBA Comments: very slow, poor posture, fatigued                                                                                                                                TREATMENT DATE:   Nustep L 5 LE only 6 in alt step tap 2 sets 20 with SPC, CGA and cuing to not swing leg BOSU step 10 x BIL with UE support- cued to not pull with UE Mini squats with UE support 2 sets 10- cued for = wt 1.5# hip flex , knee flex into ext for side step of TM- trying to facilitate hip flexors in frontal plane 2 sets 10 STS wt ball 10 x Seated  hip flex into abd onto 4 inch step 2 sets 10 BIL min A Leg press 40# 2 sets 10 40#, seat 8. 20# SL 10 x        05/22/24  Nustep L5x8 minutes seat 13, all four extremities Gait approximately 18ft carrying 5# wt no device, min guard   Forward step ups 4 inch step x10 B Standing marches 0# each LE x20 alternating, U UE support  Standing hip ABD green TB x12 B Mini-squats in bars with green TB above knees x10 cues for wt on heels   Wide tandem on blue foam pad 4x30 seconds alternating  Side steps on blue foam pad x5 laps // bars  One foot on BOSU/other on  land 4x30 seconds alternating     05/17/24 Nustep L 5 1 min then L 3 Resisted gait fwd and back 5 x each then laterally 3 x with SPC and CGA 20 # STS without UE from elevated mat 10 x 4 inch step up with UE fwd and laterally BIL 10 x each 4 inch step stap 10 x each -cuing to not circumduct Red tband BIL SL hip flex,ext and abd 10 x 20# BIL HS curl 2 sets 10, SL 10# Left , 15# RT 10 x each Knee ext 10# 2 sets 10, SL 5# 2 sets 5 lacking full TKE Leg press 40# BIL 2 sets 10, SL   20# 10 x each., toe raises 40# 2 sets 12  05/14/24  Nustep L5x8 minutes all four extremities seat 13  Bridges + ABD into red TB x12 Sidelying clams red TB x8 B Sidelying R hip flexion no resistance x10  STS from high mat table x8 no UEs  Tandem stance 4x30 seconds alternating Tandem walks in // bars light UE support x4 laps    05/08/24 Evaluation Nustep level 5 x 5 minutes Leg press 20# x10, 40# x10, then single legs 20# small ROM   PATIENT EDUCATION:  Education details: POC Person educated: Patient Education method: Explanation Education comprehension: verbalized understanding  HOME EXERCISE PROGRAM:  05/28/24 HEP- STS from EOB, alt step tap 20 x and step ups 10x 2-3 x per day    Access Code: [email or web address] URL: [email or web address] Date: 05/14/2024 Prepared by: Josette Rough  ASSESSMENT:  CLINICAL IMPRESSION:  Pnt arrives without c/o pain and feels he is getting stronger. Amb with SPC with better stride but still slower gait. Progressed strength but pnt really struggles activating BIL hip flex RT > Left. Pnt also has poor mobility in LB that could be contributing. Assistance with hip flex ex and uses hands to get on and off machines to lift legs. Getting off leg press RT leg just falls off like dead wt. Added to HEP   EVAL: Patient is a 74 y.o. male who was seen today for physical therapy evaluation and treatment for s/p right THA anterior approach, he is very slow with his  gait using a SPC.  He cannot get up from sitting without using hands, he cannot raise either hip without using his hands, very poor hip flexors. Poor glutes, poor abs, and the left DF is very weak.  3 MWT was 200 feet very slow and fatigued  OBJECTIVE IMPAIRMENTS: Abnormal gait, cardiopulmonary status limiting activity, decreased activity tolerance, decreased balance, decreased coordination, decreased endurance, decreased mobility, difficulty walking, decreased ROM, decreased strength, increased edema, increased muscle spasms, impaired flexibility, improper body mechanics, postural dysfunction, and pain.    REHAB POTENTIAL: Good  CLINICAL DECISION MAKING: Evolving/moderate complexity  EVALUATION COMPLEXITY: Moderate   GOALS: Goals reviewed with patient? Yes  SHORT TERM GOALS: Target date: 06/02/24 Independent with initial HEP Baseline: Goal status: INITIAL   LONG TERM GOALS: Target date: 08/06/24  Independent with advanced HEP Baseline:  Goal status: INITIAL  2.  Improve to 500 feet Baseline: 200 feet Goal status: INITIAL  3.  Improve TUG to 25 seconds Baseline: cannot get up from sitting without using hands Goal status: INITIAL  4.  Improve active hip flexion in standing to 90 degrees Baseline: 40 degrees Goal status: INITIAL  5.  Be able to get in  the car wihtout lifting his legs with his hands Baseline:  Goal status: INITIAL  6.  Walk 400 feet without assistive device Baseline:  Goal status: INITIAL   PLAN:  PT FREQUENCY: 2x/week  PT DURATION: 12 weeks  PLANNED INTERVENTIONS: 97164- PT Re-evaluation, 97110-Therapeutic exercises, 97530- Therapeutic activity, 97112- Neuromuscular re-education, 97535- Self Care, 02859- Manual therapy, [phone]- Gait training, [phone]- Electrical stimulation (unattended), Patient/Family education, Balance training, Stair training, Taping, Joint mobilization, Cryotherapy, and Moist heat  PLAN FOR NEXT SESSION: functional  strengthening, review/progress HEP PRN, work on balance. Progress STS/work on mechanics here. Could eventually try TM to assess safety as he has one at home he'd like to use . ASSESS GOALS  Jon Or PTA 05/28/24 11:40 AM  "

## 2024-05-31 ENCOUNTER — Ambulatory Visit: Admitting: Physical Therapy

## 2024-06-04 ENCOUNTER — Ambulatory Visit: Admitting: Physical Therapy

## 2024-06-05 ENCOUNTER — Ambulatory Visit: Admitting: Orthopaedic Surgery

## 2024-06-06 ENCOUNTER — Inpatient Hospital Stay

## 2024-06-06 ENCOUNTER — Inpatient Hospital Stay: Admitting: Hematology and Oncology

## 2024-06-07 ENCOUNTER — Ambulatory Visit: Admitting: Physical Therapy
